# Patient Record
Sex: Male | Born: 1955 | Race: Black or African American | Hispanic: No | Marital: Married | Smoking: Never smoker
Health system: Southern US, Community
[De-identification: ages and names within clinical notes are randomized; demographics above are authoritative.]

## PROBLEM LIST (undated history)

## (undated) DIAGNOSIS — E119 Type 2 diabetes mellitus without complications: Secondary | ICD-10-CM

## (undated) DIAGNOSIS — I10 Essential (primary) hypertension: Secondary | ICD-10-CM

## (undated) DIAGNOSIS — Z87442 Personal history of urinary calculi: Secondary | ICD-10-CM

## (undated) DIAGNOSIS — N400 Enlarged prostate without lower urinary tract symptoms: Secondary | ICD-10-CM

## (undated) DIAGNOSIS — K635 Polyp of colon: Secondary | ICD-10-CM

## (undated) HISTORY — DX: Polyp of colon: K63.5

## (undated) HISTORY — DX: Essential (primary) hypertension: I10

## (undated) HISTORY — DX: Type 2 diabetes mellitus without complications: E11.9

## (undated) HISTORY — DX: Benign prostatic hyperplasia without lower urinary tract symptoms: N40.0

---

## 2007-03-14 ENCOUNTER — Ambulatory Visit: Payer: Self-pay | Admitting: General Surgery

## 2007-03-14 HISTORY — PX: COLONOSCOPY W/ BIOPSIES AND POLYPECTOMY: SHX1376

## 2012-05-15 ENCOUNTER — Encounter: Payer: Self-pay | Admitting: *Deleted

## 2012-06-06 ENCOUNTER — Ambulatory Visit: Payer: Self-pay | Admitting: General Surgery

## 2012-06-26 ENCOUNTER — Ambulatory Visit: Payer: Self-pay | Admitting: General Surgery

## 2012-06-28 ENCOUNTER — Encounter: Payer: Self-pay | Admitting: *Deleted

## 2012-08-15 ENCOUNTER — Ambulatory Visit: Payer: Self-pay | Admitting: General Surgery

## 2012-08-23 ENCOUNTER — Ambulatory Visit: Payer: Self-pay | Admitting: General Surgery

## 2012-09-06 ENCOUNTER — Encounter: Payer: Self-pay | Admitting: General Surgery

## 2012-09-14 ENCOUNTER — Ambulatory Visit (INDEPENDENT_AMBULATORY_CARE_PROVIDER_SITE_OTHER): Payer: BC Managed Care – PPO | Admitting: General Surgery

## 2012-09-14 ENCOUNTER — Encounter: Payer: Self-pay | Admitting: General Surgery

## 2012-09-14 VITALS — BP 138/80 | HR 74 | Resp 18 | Ht 66.0 in | Wt 162.0 lb

## 2012-09-14 DIAGNOSIS — Z860100 Personal history of colon polyps, unspecified: Secondary | ICD-10-CM | POA: Insufficient documentation

## 2012-09-14 MED ORDER — POLYETHYLENE GLYCOL 3350 17 GM/SCOOP PO POWD: ORAL | Status: DC

## 2012-09-14 NOTE — Progress Notes (Signed)
Patient ID: Brendan West, male   DOB: 10-12-1955, 57 y.o.   MRN: [id number]  Chief Complaint  Patient presents with  . Other    evaluation for colonscopy    HPI Brendan West is a 57 y.o. male who presents for an evaluation for colonoscopy. The last colonoscopy was done in 2009 where colon polyps were found. The patients brother also has a history of colon polyps as well. The patient denies any problems at this time.   HPI  Past Medical History  Diagnosis Date  . Hypertension     2008  . Colon polyp   . Prostate enlargement   . Diabetes mellitus without complication     Past Surgical History  Procedure Laterality Date  . Colonoscopy w/ biopsies and polypectomy  03/14/2007    8 mm polyp in the proximal ascending colon resected & retrieved. Diverticulosis sigmoid colon. Familt history of ad3nometous polyps    Family History  Problem Relation Age of Onset  . Colon polyps Brother     2008    Social History History  Substance Use Topics  . Smoking status: Never Smoker   . Smokeless tobacco: Not on file  . Alcohol Use: Yes     Comment: social    No Known Allergies  Current Outpatient Prescriptions  Medication Sig Dispense Refill  . dutasteride (AVODART) 0.5 MG capsule Take 0.5 mg by mouth daily.      . metFORMIN (GLUCOPHAGE) 500 MG tablet Take 500 mg by mouth 2 (two) times daily with a meal.      . tadalafil (CIALIS) 5 MG tablet Take 5 mg by mouth daily as needed for erectile dysfunction.      . valsartan-hydrochlorothiazide (DIOVAN-HCT) 160-12.5 MG per tablet Take 1 tablet by mouth daily.      . polyethylene glycol powder (GLYCOLAX/MIRALAX) powder 255 grams one bottle for colonoscopy prep  255 g  0   No current facility-administered medications for this visit.    Review of Systems Review of Systems  Constitutional: Negative.   Respiratory: Negative.   Cardiovascular: Negative.   Gastrointestinal: Negative.     Blood pressure 138/80, pulse 74, resp. rate 18,  height 5\' 6"  (1.676 m), weight 162 lb (73.483 kg).  Physical Exam Physical Exam  Constitutional: He is oriented to person, place, and time. He appears well-developed and well-nourished.  Neck: No thyromegaly present.  Cardiovascular: Normal rate, regular rhythm and normal heart sounds.   No murmur heard. Pulmonary/Chest: Effort normal and breath sounds normal.  Lymphadenopathy:    He has no cervical adenopathy.  Neurological: He is alert and oriented to person, place, and time.  Skin: Skin is warm and dry.    Data Reviewed Colonoscopy completed March 14, 2007 showed an 8 mm tubular adenoma in the ascending colon.  Assessment    Candidate for screening colonoscopy.     Plan    A colonoscopy will be scheduled at a convenient date.     Patient has been scheduled for a colonoscopy on 11-22-12. This patient has been asked to hold metformin day of colonoscopy prep and procedure.   Brendan West 09/15/2012, 7:49 PM

## 2012-09-14 NOTE — Patient Instructions (Addendum)
Colonoscopy A colonoscopy is an exam to evaluate your entire colon. In this exam, your colon is cleansed. A long fiberoptic tube is inserted through your rectum and into your colon. The fiberoptic scope (endoscope) is a long bundle of enclosed and very flexible fibers. These fibers transmit light to the area examined and send images from that area to your caregiver. Discomfort is usually minimal. You may be given a drug to help you sleep (sedative) during or prior to the procedure. This exam helps to detect lumps (tumors), polyps, inflammation, and areas of bleeding. Your caregiver may also take a small piece of tissue (biopsy) that will be examined under a microscope. LET YOUR CAREGIVER KNOW ABOUT:   Allergies to food or medicine.  Medicines taken, including vitamins, herbs, eyedrops, over-the-counter medicines, and creams.  Use of steroids (by mouth or creams).  Previous problems with anesthetics or numbing medicines.  History of bleeding problems or blood clots.  Previous surgery.  Other health problems, including diabetes and kidney problems.  Possibility of pregnancy, if this applies. BEFORE THE PROCEDURE   A clear liquid diet may be required for 2 days before the exam.  Ask your caregiver about changing or stopping your regular medications.  Liquid injections (enemas) or laxatives may be required.  A large amount of electrolyte solution may be given to you to drink over a short period of time. This solution is used to clean out your colon.  You should be present 60 minutes prior to your procedure or as directed by your caregiver. AFTER THE PROCEDURE   If you received a sedative or pain relieving medication, you will need to arrange for someone to drive you home.  Occasionally, there is a little blood passed with the first bowel movement. Do not be concerned. FINDING OUT THE RESULTS OF YOUR TEST Not all test results are available during your visit. If your test results are  not back during the visit, make an appointment with your caregiver to find out the results. Do not assume everything is normal if you have not heard from your caregiver or the medical facility. It is important for you to follow up on all of your test results. HOME CARE INSTRUCTIONS   It is not unusual to pass moderate amounts of gas and experience mild abdominal cramping following the procedure. This is due to air being used to inflate your colon during the exam. Walking or a warm pack on your belly (abdomen) may help.  You may resume all normal meals and activities after sedatives and medicines have worn off.  Only take over-the-counter or prescription medicines for pain, discomfort, or fever as directed by your caregiver. Do not use aspirin or blood thinners if a biopsy was taken. Consult your caregiver for medicine usage if biopsies were taken. SEEK IMMEDIATE MEDICAL CARE IF:   You have a fever.  You pass large blood clots or fill a toilet with blood following the procedure. This may also occur 10 to 14 days following the procedure. This is more likely if a biopsy was taken.  You develop abdominal pain that keeps getting worse and cannot be relieved with medicine. Document Released: 01/09/2000 Document Revised: 04/05/2011 Document Reviewed: 08/24/2007 [facility] Patient Information 2014 [place], [place].  Patient has been scheduled for a colonoscopy on 11-22-12. This patient has been asked to hold metformin day of colonoscopy prep and procedure.

## 2012-09-15 ENCOUNTER — Encounter: Payer: Self-pay | Admitting: General Surgery

## 2012-11-05 ENCOUNTER — Other Ambulatory Visit: Payer: Self-pay | Admitting: General Surgery

## 2012-11-05 DIAGNOSIS — Z86010 Personal history of colonic polyps: Secondary | ICD-10-CM

## 2012-11-06 ENCOUNTER — Telehealth: Payer: Self-pay | Admitting: *Deleted

## 2012-11-06 NOTE — Telephone Encounter (Signed)
Message has been left for patient to call the office at home and cell numbers.

## 2012-11-06 NOTE — Telephone Encounter (Signed)
Message copied by Nicholes Mango on Mon Nov 06, 2012 10:31 AM ------      Message from: [place], [place]      Created: Sun Nov 05, 2012  8:20 AM       Contact patient to confirm no change in health status.  ------

## 2012-11-07 ENCOUNTER — Telehealth: Payer: Self-pay | Admitting: *Deleted

## 2012-11-07 NOTE — Telephone Encounter (Signed)
Patient reports he has had no change in his health status since last office visit. He also reports medications are still the same. Patient was instructed to pre-register either this week or next. We will proceed with colonoscopy that is scheduled at [facility] for 11-22-12. He was instructed to call the office if he has further questions.

## 2012-11-22 ENCOUNTER — Ambulatory Visit: Payer: Self-pay | Admitting: General Surgery

## 2012-11-22 DIAGNOSIS — Z1211 Encounter for screening for malignant neoplasm of colon: Secondary | ICD-10-CM

## 2012-11-22 DIAGNOSIS — Z86010 Personal history of colonic polyps: Secondary | ICD-10-CM

## 2012-11-22 HISTORY — PX: COLONOSCOPY: SHX174

## 2012-11-23 ENCOUNTER — Encounter: Payer: Self-pay | Admitting: General Surgery

## 2013-04-07 ENCOUNTER — Emergency Department: Payer: Self-pay | Admitting: Internal Medicine

## 2013-04-07 LAB — URINALYSIS, COMPLETE
BACTERIA: NONE SEEN
GLUCOSE, UR: NEGATIVE mg/dL (ref 0–75)
Hyaline Cast: 1
KETONE: NEGATIVE
LEUKOCYTE ESTERASE: NEGATIVE
NITRITE: NEGATIVE
Ph: 5 (ref 4.5–8.0)
Protein: 30
RBC,UR: 91 /HPF (ref 0–5)
Specific Gravity: 1.021 (ref 1.003–1.030)
Squamous Epithelial: 1
WBC UR: NONE SEEN /HPF (ref 0–5)

## 2013-04-07 LAB — COMPREHENSIVE METABOLIC PANEL
ALK PHOS: 47 U/L
ANION GAP: 6 — AB (ref 7–16)
Albumin: 4 g/dL (ref 3.4–5.0)
BILIRUBIN TOTAL: 0.3 mg/dL (ref 0.2–1.0)
BUN: 17 mg/dL (ref 7–18)
CALCIUM: 9.3 mg/dL (ref 8.5–10.1)
CHLORIDE: 110 mmol/L — AB (ref 98–107)
CO2: 25 mmol/L (ref 21–32)
CREATININE: 1.25 mg/dL (ref 0.60–1.30)
EGFR (African American): >60
EGFR (Non-African Amer.): >60
Glucose: 120 mg/dL — ABNORMAL HIGH (ref 65–99)
OSMOLALITY: 284 (ref 275–301)
Potassium: 3.7 mmol/L (ref 3.5–5.1)
SGOT(AST): 37 U/L (ref 15–37)
SGPT (ALT): 55 U/L (ref 12–78)
Sodium: 141 mmol/L (ref 136–145)
TOTAL PROTEIN: 7.6 g/dL (ref 6.4–8.2)

## 2013-04-07 LAB — CBC
HCT: 37.5 % — ABNORMAL LOW (ref 40.0–52.0)
HGB: 12.5 g/dL — AB (ref 13.0–18.0)
MCH: 28.3 pg (ref 26.0–34.0)
MCHC: 33.2 g/dL (ref 32.0–36.0)
MCV: 85 fL (ref 80–100)
Platelet: 191 10*3/uL (ref 150–440)
RBC: 4.41 10*6/uL (ref 4.40–5.90)
RDW: 14.6 % — ABNORMAL HIGH (ref 11.5–14.5)
WBC: 5.7 10*3/uL (ref 3.8–10.6)

## 2013-04-07 LAB — TROPONIN I: Troponin-I: <0.02 ng/mL

## 2014-11-10 IMAGING — CT CT STONE STUDY
1 of 2 series · 15 of 32 positions shown, 19 images · non-contrast
Comparison: None

CLINICAL DATA: Left-sided flank pain, hematuria

EXAM:
CT ABDOMEN AND PELVIS WITHOUT CONTRAST
TECHNIQUE: Multidetector CT imaging of the abdomen and pelvis was performed
following the standard protocol without IV contrast.

[Series 2: stone standard full · axial · 0.83mm/px · z∈[-702,-272]mm · 15 of 94 slices shown, 19 images]
[im 4/94  soft-tissue]
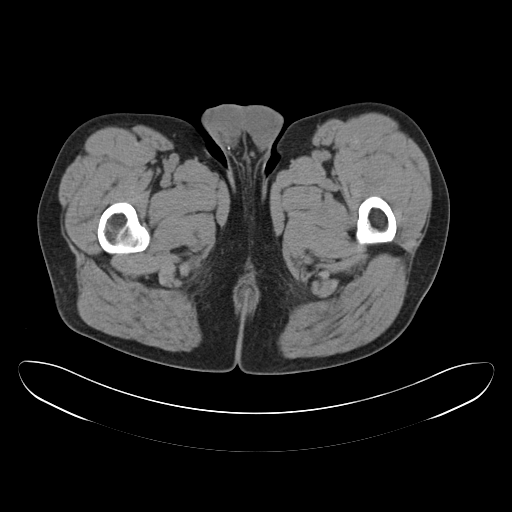
[im 4/94  bone]
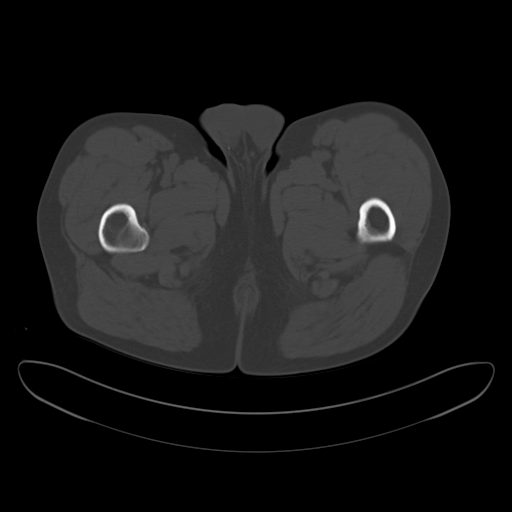
[im 12/94  soft-tissue]
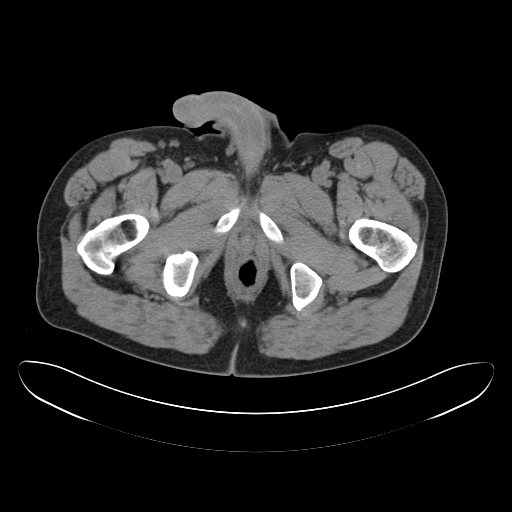
[im 19/94  soft-tissue]
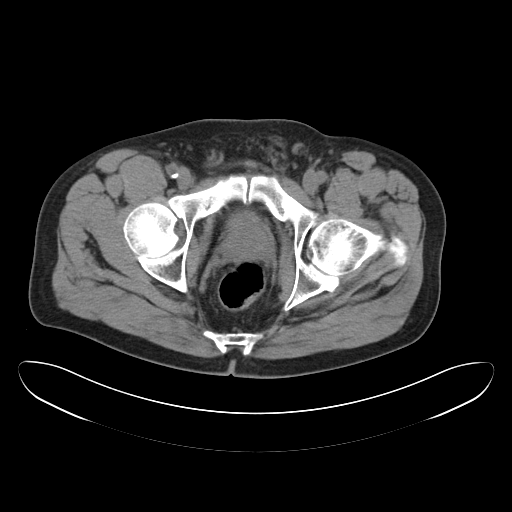
[im 27/94  soft-tissue]
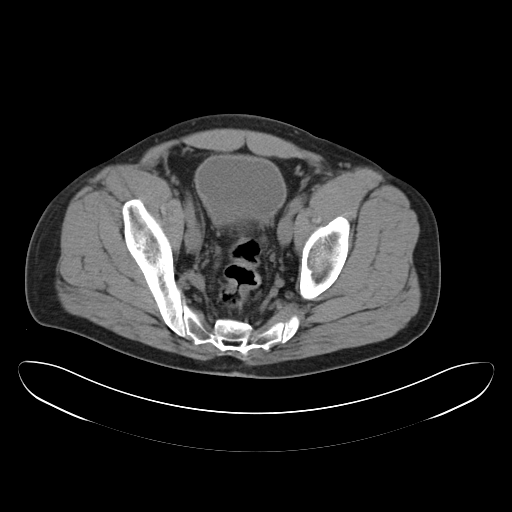
[im 34/94  soft-tissue]
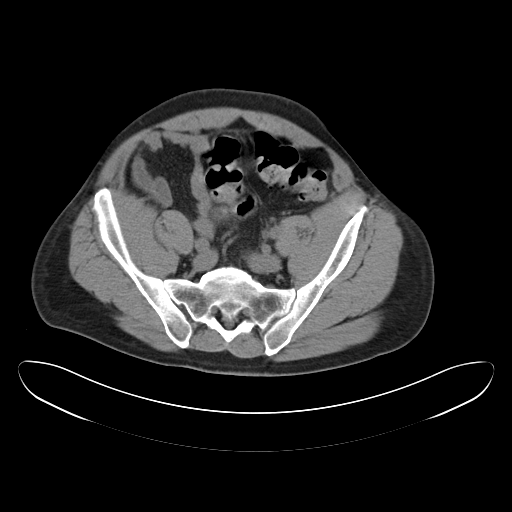
[im 41/94  soft-tissue]
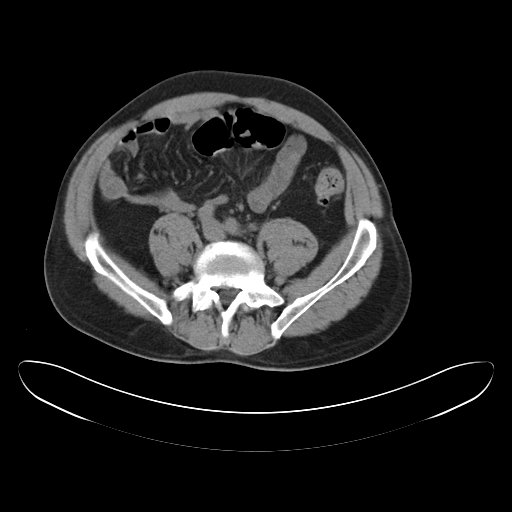
[im 49/94  soft-tissue]
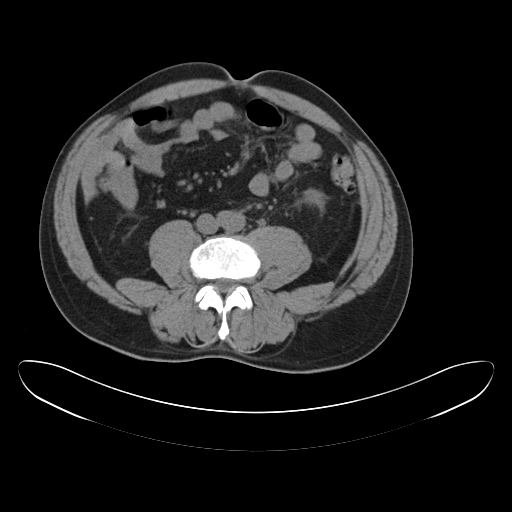
[im 53/94  soft-tissue]
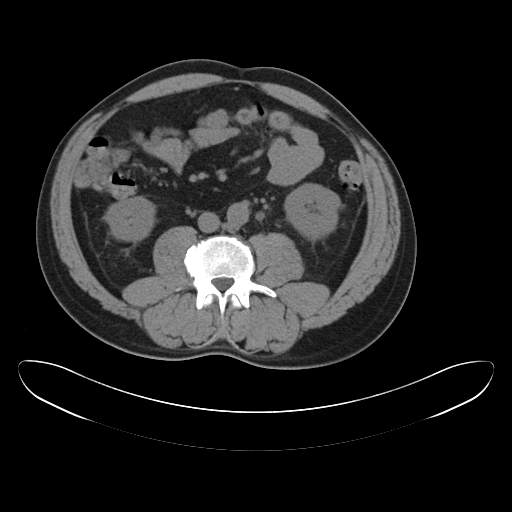
[im 60/94  soft-tissue]
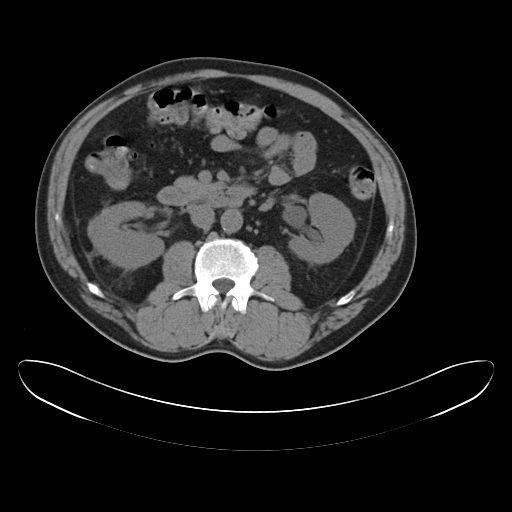
[im 60/94  bone]
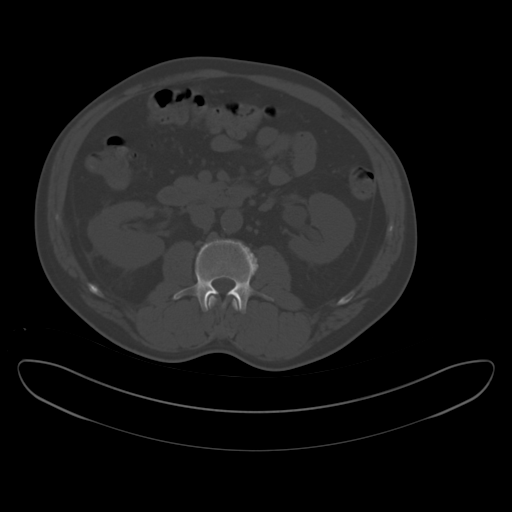
[im 67/94  soft-tissue]
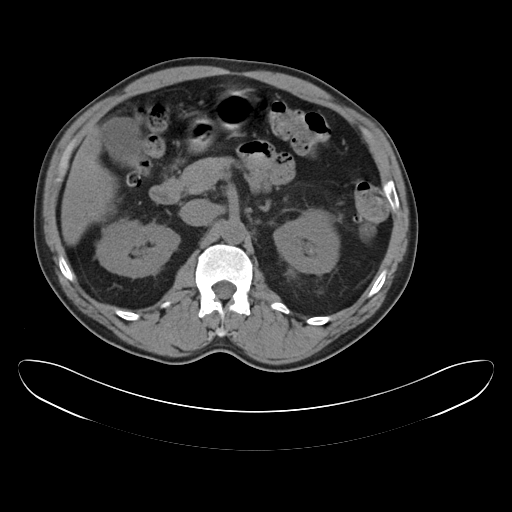
[im 75/94  soft-tissue]
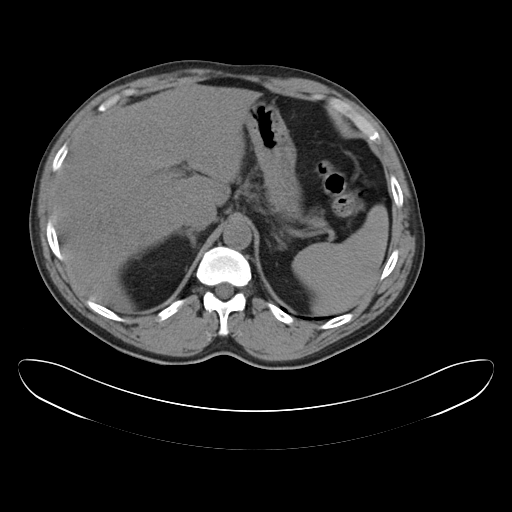
[im 79/94  lung]
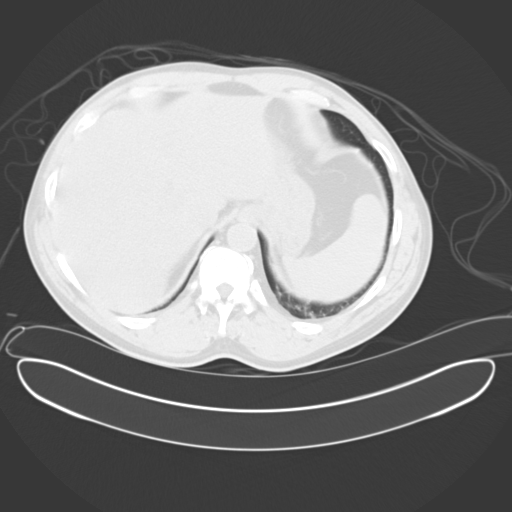
[im 82/94  soft-tissue]
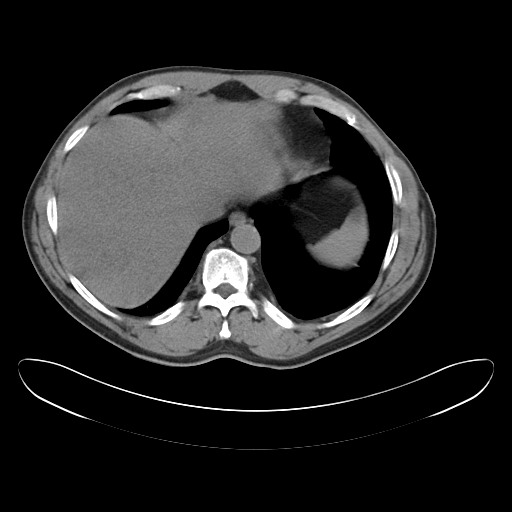
[im 82/94  lung]
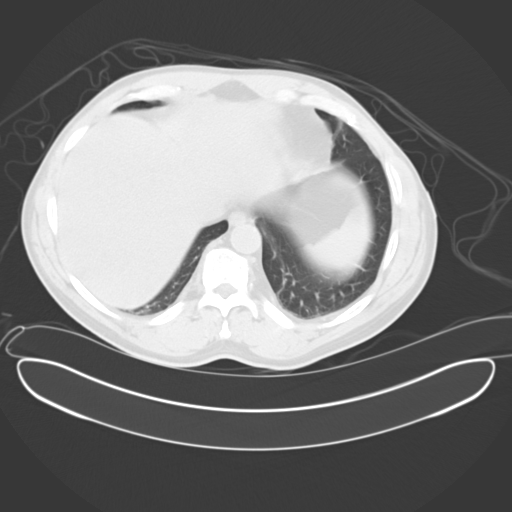
[im 86/94  lung]
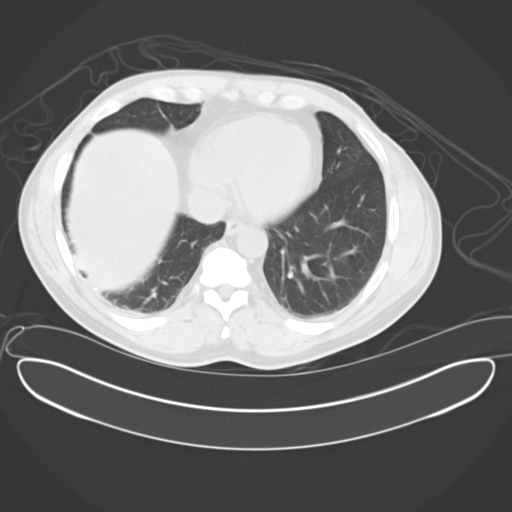
[im 90/94  soft-tissue]
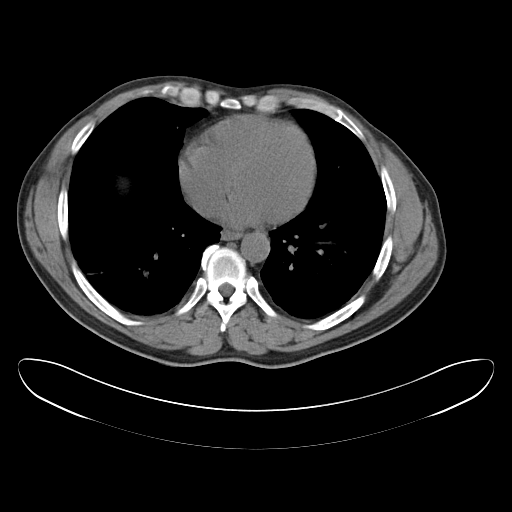
[im 90/94  lung]
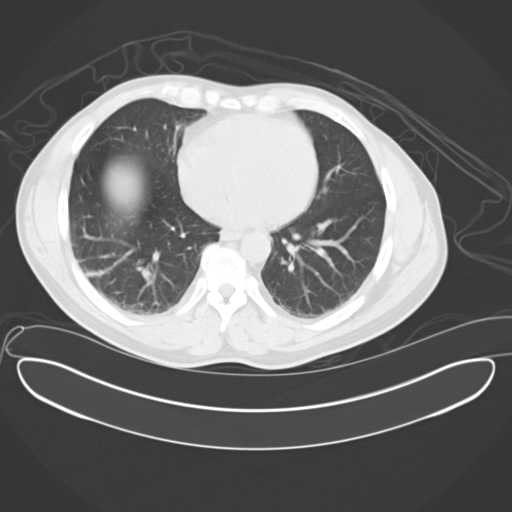

[15 of 32 positions shown; findings below may reference images not displayed]

FINDINGS: Subpleural dependent atelectasis and/or scarring noted. There is
mild left hydroureteronephrosis to the level of a 3 mm left
ureterovesicular junction calculus. Nonobstructing bilateral renal
calculi are noted, including a 1 mm left upper pole calculus image
28 and 1.1 cm right lower renal pole calculus image 39. Moderate
perinephric stranding is nonspecific. No perinephric fluid
collection or significant fluid to suggest definite forniceal
rupture. Unenhanced liver, gallbladder, adrenal glands, spleen, and
pancreas appear unremarkable. No ascites or lymphadenopathy.

Minimal atheromatous aortic calcification without aneurysm. The
appendix is normal. Bladder otherwise unremarkable. No bowel wall
thickening or focal segmental dilatation. No pelvic free fluid or
lymphadenopathy. Mild moderate lumbar spine disc degenerative change
is noted.
IMPRESSION: 3 mm left ureterovesicular junction calculus producing mild proximal
left hydroureteronephrosis.

## 2016-05-28 ENCOUNTER — Emergency Department: Payer: BLUE CROSS/BLUE SHIELD

## 2016-05-28 ENCOUNTER — Emergency Department
Admission: EM | Admit: 2016-05-28 | Discharge: 2016-05-28 | Disposition: A | Discharge status: Home / Self Care | Payer: BLUE CROSS/BLUE SHIELD | Attending: Emergency Medicine | Admitting: Emergency Medicine

## 2016-05-28 ENCOUNTER — Encounter: Payer: Self-pay | Admitting: *Deleted

## 2016-05-28 DIAGNOSIS — M5416 Radiculopathy, lumbar region: Secondary | ICD-10-CM | POA: Diagnosis not present

## 2016-05-28 DIAGNOSIS — Z79899 Other long term (current) drug therapy: Secondary | ICD-10-CM | POA: Diagnosis not present

## 2016-05-28 DIAGNOSIS — E119 Type 2 diabetes mellitus without complications: Secondary | ICD-10-CM | POA: Insufficient documentation

## 2016-05-28 DIAGNOSIS — Z7984 Long term (current) use of oral hypoglycemic drugs: Secondary | ICD-10-CM | POA: Insufficient documentation

## 2016-05-28 DIAGNOSIS — I10 Essential (primary) hypertension: Secondary | ICD-10-CM | POA: Diagnosis not present

## 2016-05-28 DIAGNOSIS — M25551 Pain in right hip: Secondary | ICD-10-CM | POA: Diagnosis present

## 2016-05-28 MED ORDER — TRAMADOL HCL 50 MG PO TABS: 50.0000 mg | ORAL_TABLET | Freq: Four times a day (QID) | ORAL | 0 refills | Status: DC | PRN

## 2016-05-28 NOTE — ED Provider Notes (Signed)
[facility] Emergency Department Provider Note ____________________________________________  Time seen: Approximately 12:24 PM  I have reviewed the triage vital signs and the nursing notes.   HISTORY  Chief Complaint Leg Pain    HPI Brendan West is a 61 y.o. male who presents to the ER for evaluationof right hip and back pain that radiates down the right leg. Symptoms started 4 days ago. He was evaluated at urgent care on Wednesday and started on Flexeril and prednisone. He states that the pain has not improved. 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 He denies similar symptoms in the past. He denies injury.  Past Medical History:  Diagnosis Date  . Colon polyp   . Diabetes mellitus without complication (HCC)   . Hypertension    2008  . Prostate enlargement     Patient Active Problem List   Diagnosis Date Noted  . Personal history of colonic polyps 09/14/2012    Past Surgical History:  Procedure Laterality Date  . COLONOSCOPY W/ BIOPSIES AND POLYPECTOMY  03/14/2007   8 mm polyp in the proximal ascending colon resected & retrieved. Diverticulosis sigmoid colon. Familt history of ad3nometous polyps    Prior to Admission medications   Medication Sig Start Date End Date Taking? Authorizing Provider  dutasteride (AVODART) 0.5 MG capsule Take 0.5 mg by mouth daily.    Historical Provider, MD  metFORMIN (GLUCOPHAGE) 500 MG tablet Take 500 mg by mouth 2 (two) times daily with a meal.    Historical Provider, MD  polyethylene glycol powder (GLYCOLAX/MIRALAX) powder 255 grams one bottle for colonoscopy prep 09/14/12   Earline MayotteJeffrey W Byrnett, MD  tadalafil (CIALIS) 5 MG tablet Take 5 mg by mouth daily as needed for erectile dysfunction.    Historical Provider, MD  traMADol (ULTRAM) 50 MG tablet Take 1 tablet (50 mg total) by mouth every 6 (six) hours as needed. 05/28/16   Chinita Pesterari B Ameli Sangiovanni, FNP   valsartan-hydrochlorothiazide (DIOVAN-HCT) 160-12.5 MG per tablet Take 1 tablet by mouth daily.    Historical Provider, MD    Allergies Patient has no known allergies.  Family History  Problem Relation Age of Onset  . Colon polyps Brother     2008    Social History Social History  Substance Use Topics  . Smoking status: Never Smoker  . Smokeless tobacco: Not on file  . Alcohol use Yes     Comment: social    Review of Systems Constitutional: No recent illness. Cardiovascular: Denies chest pain or palpitations. Respiratory: Denies shortness of breath. Musculoskeletal: Pain in right lower back with radiation into the right lower extremity Skin: Negative for rash, wound, lesion. Neurological: Negative for focal weakness or numbness. Negative for loss of bowel or bladder control.  ____________________________________________   PHYSICAL EXAM:  VITAL SIGNS: ED Triage Vitals  Enc Vitals Group     BP 05/28/16 1119 (!) 160/83     Pulse Rate 05/28/16 1119 90     Resp 05/28/16 1119 20     Temp 05/28/16 1119 98.1 F (36.7 C)     Temp Source 05/28/16 1119 Oral     SpO2 05/28/16 1119 98 %     Weight 05/28/16 1120 170 lb (77.1 kg)     Height 05/28/16 1120 5\' 5"  (1.651 m)     Head Circumference --      Peak Flow --      Pain Score 05/28/16 1119  10     Pain Loc --      Pain Edu? --      Excl. in GC? --     Constitutional: Alert and oriented. Well appearing and in no acute distress. Eyes: Conjunctivae are normal. EOMI. Head: Atraumatic. Neck: No stridor.  Respiratory: Normal respiratory effort.   Musculoskeletal: Straight leg raise positive at about 40. Strength is normal throughout.  Neurologic:  Normal speech and language. No gross focal neurologic deficits are appreciated. Speech is normal. No gait instability. Skin:  Skin is warm, dry and intact. Atraumatic. Psychiatric: Mood and affect are normal. Speech and behavior are  normal.  ____________________________________________   LABS (all labs ordered are listed, but only abnormal results are displayed)  Labs Reviewed - No data to display ____________________________________________  RADIOLOGY  No acute lumbar spine abnormality identified on x-ray per radiology. ____________________________________________   PROCEDURES  Procedure(s) performed: None  ____________________________________________   INITIAL IMPRESSION / ASSESSMENT AND PLAN / ED COURSE  61 year old male presenting to the emergency department for evaluation of right lower back pain that radiates into the posterior right leg. Symptoms are consistent with sciatica. He'll be treated with prednisone, and Flexeril which was prescribed by urgent care. Tramadol be added. He was strongly encouraged to call his primary care doctor to schedule an appointment for Monday. He was encouraged to return to the emergency department over the weekend for symptoms that change or worsen.  Pertinent labs & imaging results that were available during my care of the patient were reviewed by me and considered in my medical decision making (see chart for details).  _________________________________________   FINAL CLINICAL IMPRESSION(S) / ED DIAGNOSES  Final diagnoses:  Lumbar radiculopathy, acute    New Prescriptions   TRAMADOL (ULTRAM) 50 MG TABLET    Take 1 tablet (50 mg total) by mouth every 6 (six) hours as needed.    If controlled substance prescribed during this visit, 12 month history viewed on the [facility] prior to issuing an initial prescription for Schedule II or III opiod.    Chinita Pester, FNP 05/28/16 1324    Myrna Blazer, MD 05/28/16 1455

## 2016-05-28 NOTE — Discharge Instructions (Signed)
Follow up with your primary care provider next week.  Continue taking the muscle relaxer and prednisone and add tramadol. Return to the ER for symptoms that change or worsen if unable to schedule an appointment.

## 2016-05-28 NOTE — ED Triage Notes (Signed)
Pt complains of right hip /back pain radiating down leg, pt denies any other symptoms , pain started Monday

## 2017-12-01 ENCOUNTER — Ambulatory Visit (INDEPENDENT_AMBULATORY_CARE_PROVIDER_SITE_OTHER): Payer: BLUE CROSS/BLUE SHIELD | Admitting: General Surgery

## 2017-12-01 ENCOUNTER — Encounter: Payer: Self-pay | Admitting: General Surgery

## 2017-12-01 ENCOUNTER — Other Ambulatory Visit: Payer: Self-pay

## 2017-12-01 VITALS — BP 130/70 | HR 82 | Resp 14 | Ht 66.0 in | Wt 162.0 lb

## 2017-12-01 DIAGNOSIS — Z86010 Personal history of colonic polyps: Secondary | ICD-10-CM

## 2017-12-01 MED ORDER — POLYETHYLENE GLYCOL 3350 17 GM/SCOOP PO POWD: ORAL | 0 refills | Status: DC

## 2017-12-01 NOTE — Progress Notes (Signed)
Patient ID: Brendan West, male   DOB: [birth year], 62 y.o.   MRN: [id number]  Chief Complaint  Patient presents with  . Colonoscopy    HPI Brendan West is a 62 y.o. male here today for his follow up colonoscopy. Last colonoscopy was 11/22/2012. Moves his bowels daily. No GI problems at this time.   Past Medical History:  Diagnosis Date  . Colon polyp   . Diabetes mellitus without complication (HCC)   . Hypertension    2008  . Prostate enlargement     Past Surgical History:  Procedure Laterality Date  . COLONOSCOPY  11/22/2012   Diverticulosis  . COLONOSCOPY W/ BIOPSIES AND POLYPECTOMY  03/14/2007   8 mm tubular adenoma without atypica in the proximal ascending colon resected & retrieved. Diverticulosis sigmoid colon. Family history of adenometous polyps    Family History  Problem Relation Age of Onset  . Colon polyps Brother        2008    Social History Social History   Tobacco Use  . Smoking status: Never Smoker  . Smokeless tobacco: Never Used  Substance Use Topics  . Alcohol use: Yes    Comment: social  . Drug use: No    No Known Allergies  Current Outpatient Medications  Medication Sig Dispense Refill  . empagliflozin (JARDIANCE) 10 MG TABS tablet Take 10 mg by mouth daily.    . finasteride (PROSCAR) 5 MG tablet Take 5 mg by mouth daily.    . metFORMIN (GLUCOPHAGE) 500 MG tablet Take 500 mg by mouth 2 (two) times daily with a meal.    . polyethylene glycol powder (GLYCOLAX/MIRALAX) powder 255 grams one bottle for colonoscopy prep 255 g 0  . sildenafil (REVATIO) 20 MG tablet Take 20 mg by mouth 3 (three) times daily.    . valsartan-hydrochlorothiazide (DIOVAN-HCT) 160-12.5 MG per tablet Take 1 tablet by mouth daily.    . polyethylene glycol powder (GLYCOLAX/MIRALAX) powder 255 grams one bottle for colonoscopy prep 255 g 0   No current facility-administered medications for this visit.     Review of Systems Review of Systems  Constitutional: Negative.    Respiratory: Negative.   Cardiovascular: Negative.     Blood pressure 130/70, pulse 82, resp. rate 14, height 5\' 6"  (1.676 m), weight 162 lb (73.5 kg).  Physical Exam Physical Exam  Constitutional: He appears well-developed and well-nourished.  Cardiovascular: Normal rate, regular rhythm and normal heart sounds.  Pulmonary/Chest: Effort normal and breath sounds normal.  Neurological: He is alert.  Skin: Skin is warm and dry.    Data Reviewed 2014 colonoscopy report showed diverticulosis.  No recurrent polyps.  Assessment    Candidate for repeat colonoscopy based on 2009 exam.    Plan  Colonoscopy with possible biopsy/polypectomy prn: Information regarding the procedure, including its potential risks and complications (including but not limited to perforation of the bowel, which may require emergency surgery to repair, and bleeding) was verbally given to the patient. Educational information regarding lower intestinal endoscopy was given to the patient. Written instructions for how to complete the bowel prep using Miralax were provided. The importance of drinking ample fluids to avoid dehydration as a result of the prep emphasized.  HPI, Physical Exam, Assessment and Plan have been scribed under the direction and in the presence of Donnalee Curry, MD.  [name], CMA   [name] 12/02/2017, 6:10 AM  I have completed the exam and reviewed the above documentation for accuracy and completeness.  I  agree with the above.  Museum/gallery conservator has been used and any errors in dictation or transcription are unintentional.  Donnalee Curry, M.D., F.A.C.S.  Patient has been scheduled for a colonoscopy on 12-07-17 at [facility]. This patient has been asked to hold metformin day of colonoscopy prep and procedure. Miralax prescription has been sent in to the patient's pharmacy today. Colonoscopy instructions have been reviewed with the patient. This patient is aware to call the office  if they have further questions.   Nicholes Mango, CMA

## 2017-12-01 NOTE — Patient Instructions (Signed)
Colonoscopy, Adult A colonoscopy is an exam to look at the entire large intestine. During the exam, a lubricated, bendable tube is inserted into the anus and then passed into the rectum, colon, and other parts of the large intestine. A colonoscopy is often done as a part of normal colorectal screening or in response to certain symptoms, such as anemia, persistent diarrhea, abdominal pain, and blood in the stool. The exam can help screen for and diagnose medical problems, including:  Tumors.  Polyps.  Inflammation.  Areas of bleeding.  Tell a health care provider about:  Any allergies you have.  All medicines you are taking, including vitamins, herbs, eye drops, creams, and over-the-counter medicines.  Any problems you or family members have had with anesthetic medicines.  Any blood disorders you have.  Any surgeries you have had.  Any medical conditions you have.  Any problems you have had passing stool. What are the risks? Generally, this is a safe procedure. However, problems may occur, including:  Bleeding.  A tear in the intestine.  A reaction to medicines given during the exam.  Infection (rare).  What happens before the procedure? Eating and drinking restrictions Follow instructions from your health care provider about eating and drinking, which may include:  A few days before the procedure - follow a low-fiber diet. Avoid nuts, seeds, dried fruit, raw fruits, and vegetables.  1-3 days before the procedure - follow a clear liquid diet. Drink only clear liquids, such as clear broth or bouillon, black coffee or tea, clear juice, clear soft drinks or sports drinks, gelatin dessert, and popsicles. Avoid any liquids that contain red or purple dye.  On the day of the procedure - do not eat or drink anything during the 2 hours before the procedure, or within the time period that your health care provider recommends.  Bowel prep If you were prescribed an oral bowel prep  to clean out your colon:  Take it as told by your health care provider. Starting the day before your procedure, you will need to drink a large amount of medicated liquid. The liquid will cause you to have multiple loose stools until your stool is almost clear or light green.  If your skin or anus gets irritated from diarrhea, you may use these to relieve the irritation: ? Medicated wipes, such as adult wet wipes with aloe and vitamin E. ? A skin soothing-product like petroleum jelly.  If you vomit while drinking the bowel prep, take a break for up to 60 minutes and then begin the bowel prep again. If vomiting continues and you cannot take the bowel prep without vomiting, call your health care provider.  General instructions  Ask your health care provider about changing or stopping your regular medicines. This is especially important if you are taking diabetes medicines or blood thinners.  Plan to have someone take you home from the hospital or clinic. What happens during the procedure?  An IV tube may be inserted into one of your veins.  You will be given medicine to help you relax (sedative).  To reduce your risk of infection: ? Your health care team will wash or sanitize their hands. ? Your anal area will be washed with soap.  You will be asked to lie on your side with your knees bent.  Your health care provider will lubricate a long, thin, flexible tube. The tube will have a camera and a light on the end.  The tube will be inserted into your   anus.  The tube will be gently eased through your rectum and colon.  Air will be delivered into your colon to keep it open. You may feel some pressure or cramping.  The camera will be used to take images during the procedure.  A small tissue sample may be removed from your body to be examined under a microscope (biopsy). If any potential problems are found, the tissue will be sent to a lab for testing.  If small polyps are found, your  health care provider may remove them and have them checked for cancer cells.  The tube that was inserted into your anus will be slowly removed. The procedure may vary among health care providers and hospitals. What happens after the procedure?  Your blood pressure, heart rate, breathing rate, and blood oxygen level will be monitored until the medicines you were given have worn off.  Do not drive for 24 hours after the exam.  You may have a small amount of blood in your stool.  You may pass gas and have mild abdominal cramping or bloating due to the air that was used to inflate your colon during the exam.  It is up to you to get the results of your procedure. Ask your health care provider, or the department performing the procedure, when your results will be ready. This information is not intended to replace advice given to you by your health care provider. Make sure you discuss any questions you have with your health care provider. Document Released: 01/09/2000 Document Revised: 11/12/2015 Document Reviewed: 03/25/2015 [employer] Interactive Patient Education  2018 [employer]  

## 2017-12-07 ENCOUNTER — Ambulatory Visit: Payer: BLUE CROSS/BLUE SHIELD | Admitting: Registered Nurse

## 2017-12-07 ENCOUNTER — Encounter
Admission: RE | Disposition: A | Discharge status: Home / Self Care | Payer: Self-pay | Source: Ambulatory Visit | Attending: General Surgery

## 2017-12-07 ENCOUNTER — Ambulatory Visit
Admission: RE | Admit: 2017-12-07 | Discharge: 2017-12-07 | Disposition: A | Discharge status: Home / Self Care | Payer: BLUE CROSS/BLUE SHIELD | Source: Ambulatory Visit | Attending: General Surgery | Admitting: General Surgery

## 2017-12-07 ENCOUNTER — Encounter: Payer: Self-pay | Admitting: *Deleted

## 2017-12-07 DIAGNOSIS — Z7984 Long term (current) use of oral hypoglycemic drugs: Secondary | ICD-10-CM | POA: Insufficient documentation

## 2017-12-07 DIAGNOSIS — Z86010 Personal history of colonic polyps: Secondary | ICD-10-CM | POA: Insufficient documentation

## 2017-12-07 DIAGNOSIS — Z1211 Encounter for screening for malignant neoplasm of colon: Secondary | ICD-10-CM | POA: Insufficient documentation

## 2017-12-07 DIAGNOSIS — N400 Enlarged prostate without lower urinary tract symptoms: Secondary | ICD-10-CM | POA: Diagnosis not present

## 2017-12-07 DIAGNOSIS — I10 Essential (primary) hypertension: Secondary | ICD-10-CM | POA: Diagnosis not present

## 2017-12-07 DIAGNOSIS — Z87442 Personal history of urinary calculi: Secondary | ICD-10-CM | POA: Diagnosis not present

## 2017-12-07 DIAGNOSIS — E119 Type 2 diabetes mellitus without complications: Secondary | ICD-10-CM | POA: Insufficient documentation

## 2017-12-07 DIAGNOSIS — K5730 Diverticulosis of large intestine without perforation or abscess without bleeding: Secondary | ICD-10-CM

## 2017-12-07 DIAGNOSIS — Z79899 Other long term (current) drug therapy: Secondary | ICD-10-CM | POA: Insufficient documentation

## 2017-12-07 DIAGNOSIS — Z8371 Family history of colonic polyps: Secondary | ICD-10-CM | POA: Insufficient documentation

## 2017-12-07 HISTORY — DX: Personal history of urinary calculi: Z87.442

## 2017-12-07 HISTORY — PX: COLONOSCOPY WITH PROPOFOL: SHX5780

## 2017-12-07 LAB — GLUCOSE, CAPILLARY: Glucose-Capillary: 161 mg/dL — ABNORMAL HIGH (ref 70–99)

## 2017-12-07 SURGERY — COLONOSCOPY WITH PROPOFOL
Anesthesia: General

## 2017-12-07 MED ORDER — LIDOCAINE HCL (CARDIAC) PF 100 MG/5ML IV SOSY
PREFILLED_SYRINGE | INTRAVENOUS | Status: DC | PRN
  Administered 2017-12-07: 40 mg via INTRAVENOUS

## 2017-12-07 MED ORDER — MIDAZOLAM HCL 2 MG/2ML IJ SOLN
INTRAMUSCULAR | Status: DC | PRN
  Administered 2017-12-07: 2 mg via INTRAVENOUS

## 2017-12-07 MED ORDER — SODIUM CHLORIDE 0.9 % IV SOLN
INTRAVENOUS | Status: DC
  Administered 2017-12-07: 1000 mL via INTRAVENOUS

## 2017-12-07 MED ORDER — MIDAZOLAM HCL 2 MG/2ML IJ SOLN
INTRAMUSCULAR | Status: AC
  Filled 2017-12-07: qty 2

## 2017-12-07 MED ORDER — PHENYLEPHRINE HCL 10 MG/ML IJ SOLN
INTRAMUSCULAR | Status: DC | PRN
  Administered 2017-12-07 (×3): 100 ug via INTRAVENOUS

## 2017-12-07 MED ORDER — PROPOFOL 500 MG/50ML IV EMUL
INTRAVENOUS | Status: AC
  Filled 2017-12-07: qty 50

## 2017-12-07 MED ORDER — PROPOFOL 500 MG/50ML IV EMUL
INTRAVENOUS | Status: DC | PRN
  Administered 2017-12-07: 150 ug/kg/min via INTRAVENOUS

## 2017-12-07 MED ORDER — PROPOFOL 10 MG/ML IV BOLUS
INTRAVENOUS | Status: DC | PRN
  Administered 2017-12-07: 90 mg via INTRAVENOUS

## 2017-12-07 NOTE — Op Note (Signed)
[facility] Gastroenterology Patient Name: Brendan GingerMack West Procedure Date: 12/07/2017 11:25 AM MRN: [id number] Account #: [id number] Date of Birth: 10/27/1955 Admit Type: Outpatient Age: 62 Room: [facility] END[facility]OM 1 Gender: Male Note Status: Finalized Procedure:            Colonoscopy Indications:          High risk colon cancer surveillance: Personal history                        of colonic polyps Providers:            Earline MayotteJeffrey W. Janise Gora, MD Referring MD:         Jillene Bucksenny C. Arlana Pouchate, MD (Referring MD) Medicines:            Monitored Anesthesia Care Complications:        No immediate complications. Procedure:            Pre-Anesthesia Assessment:                       - Prior to the procedure, a History and Physical was                        performed, and patient medications, allergies and                        sensitivities were reviewed. The patient's tolerance of                        previous anesthesia was reviewed.                       - The risks and benefits of the procedure and the                        sedation options and risks were discussed with the                        patient. All questions were answered and informed                        consent was obtained.                       After obtaining informed consent, the colonoscope was                        passed under direct vision. Throughout the procedure,                        the patient's blood pressure, pulse, and oxygen                        saturations were monitored continuously. The                        Colonoscope was introduced through the anus and                        advanced to the the cecum, identified by appendiceal  orifice and ileocecal valve. The colonoscopy was                        performed without difficulty. The patient tolerated the                        procedure well. The quality of the bowel preparation                        was  excellent. Findings:      Many medium-mouthed diverticula were found in the sigmoid colon.      The retroflexed view of the distal rectum and anal verge was normal and       showed no anal or rectal abnormalities. Impression:           - Diverticulosis in the sigmoid colon.                       - The distal rectum and anal verge are normal on                        retroflexion view.                       - No specimens collected. Recommendation:       - Repeat colonoscopy in 5 years for screening purposes. Procedure Code(s):    --- Professional ---                       [phone], Colonoscopy, flexible; diagnostic, including                        collection of specimen(s) by brushing or washing, when                        performed (separate procedure) Diagnosis Code(s):    --- Professional ---                       K57.30, Diverticulosis of large intestine without                        perforation or abscess without bleeding                       Z86.010, Personal history of colonic polyps CPT copyright 2018 American Medical Association. All rights reserved. The codes documented in this report are preliminary and upon coder review may  be revised to meet current compliance requirements. [name], MD 12/07/2017 11:52:53 AM This report has been signed electronically. Number of Addenda: 0 Note Initiated On: 12/07/2017 11:25 AM Scope Withdrawal Time: 0 hours 13 minutes 37 seconds  Total Procedure Duration: 0 hours 18 minutes 22 seconds       [facility]

## 2017-12-07 NOTE — Anesthesia Post-op Follow-up Note (Signed)
Anesthesia QCDR form completed.        

## 2017-12-07 NOTE — Anesthesia Procedure Notes (Signed)
Date/Time: 12/07/2017 11:29 AM Performed by: Stormy Fabianurtis, Rosendo Couser, CRNA Pre-anesthesia Checklist: Patient identified, Emergency Drugs available, Suction available and Patient being monitored Patient Re-evaluated:Patient Re-evaluated prior to induction Oxygen Delivery Method: Nasal cannula Induction Type: IV induction Dental Injury: Teeth and Oropharynx as per pre-operative assessment  Comments: Nasal cannula with etCO2 monitoring

## 2017-12-07 NOTE — Transfer of Care (Signed)
Immediate Anesthesia Transfer of Care Note  Patient: [name]  Procedure(s) Performed: Procedure(s): COLONOSCOPY WITH PROPOFOL (N/A)  Patient Location: PACU and Endoscopy Unit  Anesthesia Type:General  Level of Consciousness: sedated  Airway & Oxygen Therapy: Patient Spontanous Breathing and Patient connected to nasal cannula oxygen  Post-op Assessment: Report given to RN and Post -op Vital signs reviewed and stable  Post vital signs: Reviewed and stable  Last Vitals:  Vitals:   12/07/17 1026 12/07/17 1153  BP: (!) 142/94 (!) 88/49  Pulse: 88 77  Resp: 14 16  Temp: 36.6 C (!) 36.2 C  SpO2: 100% 97%    Complications: No apparent anesthesia complications

## 2017-12-07 NOTE — Anesthesia Preprocedure Evaluation (Signed)
Anesthesia Evaluation  Patient identified by MRN, date of birth, ID band Patient awake    Reviewed: Allergy & Precautions, H&P , NPO status , Patient's Chart, lab work & pertinent test results, reviewed documented beta blocker date and time   Airway Mallampati: II   Neck ROM: full    Dental  (+) Teeth Intact   Pulmonary neg pulmonary ROS,    Pulmonary exam normal        Cardiovascular hypertension, negative cardio ROS Normal cardiovascular exam Rhythm:regular Rate:Normal     Neuro/Psych negative neurological ROS  negative psych ROS   GI/Hepatic negative GI ROS, Neg liver ROS,   Endo/Other  negative endocrine ROSdiabetes  Renal/GU negative Renal ROS  negative genitourinary   Musculoskeletal   Abdominal   Peds  Hematology negative hematology ROS (+)   Anesthesia Other Findings Past Medical History: No date: Colon polyp No date: Diabetes mellitus without complication (HCC) No date: History of kidney stones No date: Hypertension     Comment:  2008 No date: Prostate enlargement Past Surgical History: 11/22/2012: COLONOSCOPY     Comment:  Diverticulosis 03/14/2007: COLONOSCOPY W/ BIOPSIES AND POLYPECTOMY     Comment:  8 mm tubular adenoma without atypica in the proximal               ascending colon resected & retrieved. Diverticulosis               sigmoid colon. Family history of adenometous polyps BMI    Body Mass Index:  26.15 kg/m     Reproductive/Obstetrics negative OB ROS                             Anesthesia Physical Anesthesia Plan  ASA: II  Anesthesia Plan: General   Post-op Pain Management:    Induction:   PONV Risk Score and Plan:   Airway Management Planned:   Additional Equipment:   Intra-op Plan:   Post-operative Plan:   Informed Consent: I have reviewed the patients History and Physical, chart, labs and discussed the procedure including the risks,  benefits and alternatives for the proposed anesthesia with the patient or authorized representative who has indicated his/her understanding and acceptance.   Dental Advisory Given  Plan Discussed with: CRNA  Anesthesia Plan Comments:         Anesthesia Quick Evaluation

## 2017-12-07 NOTE — H&P (Signed)
Brendan West [id number] July 19, 1955     HPI:  History of adenomatous polyps in the ascending colon in 2009. Diverticulosis in 2014. For follow up exam. Tolerated prep well.   Medications Prior to Admission  Medication Sig Dispense Refill Last Dose  . empagliflozin (JARDIANCE) 10 MG TABS tablet Take 10 mg by mouth daily.   Taking  . finasteride (PROSCAR) 5 MG tablet Take 5 mg by mouth daily.   Taking  . metFORMIN (GLUCOPHAGE) 500 MG tablet Take 500 mg by mouth 2 (two) times daily with a meal.   Taking  . polyethylene glycol powder (GLYCOLAX/MIRALAX) powder 255 grams one bottle for colonoscopy prep 255 g 0 Taking  . polyethylene glycol powder (GLYCOLAX/MIRALAX) powder 255 grams one bottle for colonoscopy prep 255 g 0   . sildenafil (REVATIO) 20 MG tablet Take 20 mg by mouth 3 (three) times daily.   Taking  . valsartan-hydrochlorothiazide (DIOVAN-HCT) 160-12.5 MG per tablet Take 1 tablet by mouth daily.   Taking   No Known Allergies Past Medical History:  Diagnosis Date  . Colon polyp   . Diabetes mellitus without complication (HCC)   . History of kidney stones   . Hypertension    2008  . Prostate enlargement    Past Surgical History:  Procedure Laterality Date  . COLONOSCOPY  11/22/2012   Diverticulosis  . COLONOSCOPY W/ BIOPSIES AND POLYPECTOMY  03/14/2007   8 mm tubular adenoma without atypica in the proximal ascending colon resected & retrieved. Diverticulosis sigmoid colon. Family history of adenometous polyps   Social History   Socioeconomic History  . Marital status: Married    Spouse name: Not on file  . Number of children: Not on file  . Years of education: Not on file  . Highest education level: Not on file  Occupational History  . Not on file  Social Needs  . Financial resource strain: Not on file  . Food insecurity:    Worry: Not on file    Inability: Not on file  . Transportation needs:    Medical: Not on file    Non-medical: Not on file  Tobacco Use  .  Smoking status: Never Smoker  . Smokeless tobacco: Never Used  Substance and Sexual Activity  . Alcohol use: Yes    Comment: social  . Drug use: No  . Sexual activity: Not on file  Lifestyle  . Physical activity:    Days per week: Not on file    Minutes per session: Not on file  . Stress: Not on file  Relationships  . Social connections:    Talks on phone: Not on file    Gets together: Not on file    Attends religious service: Not on file    Active member of club or organization: Not on file    Attends meetings of clubs or organizations: Not on file    Relationship status: Not on file  . Intimate partner violence:    Fear of current or ex partner: Not on file    Emotionally abused: Not on file    Physically abused: Not on file    Forced sexual activity: Not on file  Other Topics Concern  . Not on file  Social History Narrative  . Not on file   Social History   Social History Narrative  . Not on file     ROS: Negative.     PE: HEENT: Negative. Lungs: Clear. Cardio: RR.  Assessment/Plan:  Proceed with planned endoscopy.  Merrily PewJeffrey W Generoso Cropper 12/07/2017

## 2017-12-08 ENCOUNTER — Encounter: Payer: Self-pay | Admitting: General Surgery

## 2017-12-08 NOTE — Anesthesia Postprocedure Evaluation (Signed)
Anesthesia Post Note  Patient: Brendan West  Procedure(s) Performed: COLONOSCOPY WITH PROPOFOL (N/A )  Patient location during evaluation: PACU Anesthesia Type: General Level of consciousness: awake and alert Pain management: pain level controlled Vital Signs Assessment: post-procedure vital signs reviewed and stable Respiratory status: spontaneous breathing, nonlabored ventilation, respiratory function stable and patient connected to nasal cannula oxygen Cardiovascular status: blood pressure returned to baseline and stable Postop Assessment: no apparent nausea or vomiting Anesthetic complications: no     Last Vitals:  Vitals:   12/07/17 1213 12/07/17 1223  BP: 110/78 131/78  Pulse: 71 67  Resp: (!) 22 15  Temp:    SpO2: 100% 100%    Last Pain:  Vitals:   12/07/17 1223  TempSrc:   PainSc: 0-No pain                 Yevette EdwardsJames G Adams

## 2018-08-23 ENCOUNTER — Other Ambulatory Visit: Payer: Self-pay | Admitting: Internal Medicine

## 2018-08-23 DIAGNOSIS — R7989 Other specified abnormal findings of blood chemistry: Secondary | ICD-10-CM

## 2018-08-25 ENCOUNTER — Ambulatory Visit
Admission: RE | Admit: 2018-08-25 | Discharge: 2018-08-25 | Disposition: A | Discharge status: Home / Self Care | Payer: BC Managed Care – PPO | Source: Ambulatory Visit | Attending: Internal Medicine | Admitting: Internal Medicine

## 2018-08-25 ENCOUNTER — Encounter (INDEPENDENT_AMBULATORY_CARE_PROVIDER_SITE_OTHER): Payer: Self-pay

## 2018-08-25 ENCOUNTER — Other Ambulatory Visit: Payer: Self-pay

## 2018-08-25 DIAGNOSIS — R945 Abnormal results of liver function studies: Secondary | ICD-10-CM | POA: Insufficient documentation

## 2018-08-25 DIAGNOSIS — R7989 Other specified abnormal findings of blood chemistry: Secondary | ICD-10-CM

## 2020-03-29 IMAGING — US ULTRASOUND ABDOMEN COMPLETE
1 series · 14 of 25 positions shown · non-contrast
Comparison: None.

CLINICAL DATA: Elevated LFTs.

EXAM:
ABDOMEN ULTRASOUND COMPLETE

[Series 1: ultrasound abdomen complete · 0.22mm/px · 14 of 104 slices shown]
[im 1/104]
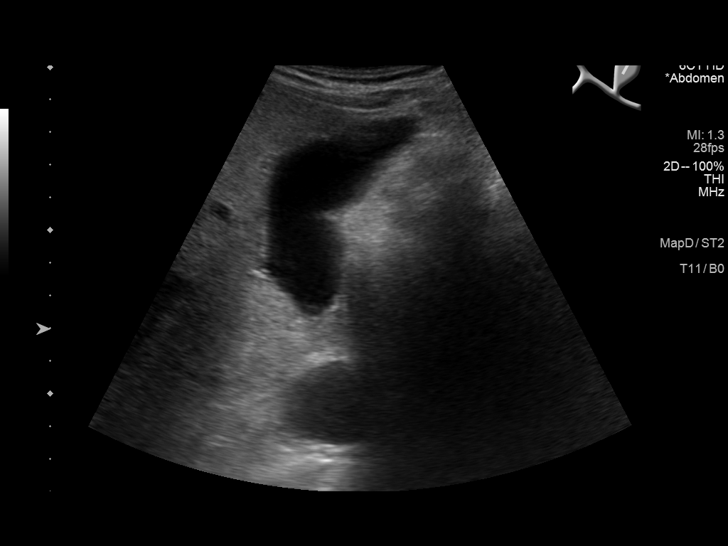
[im 9/104]
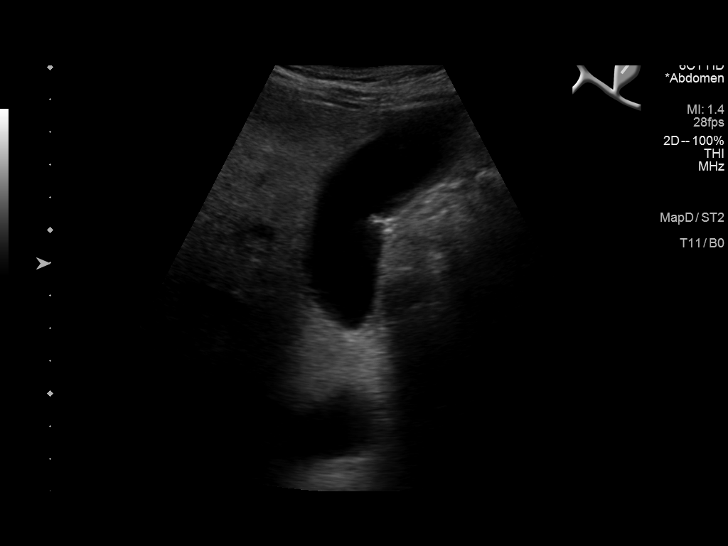
[im 18/104]
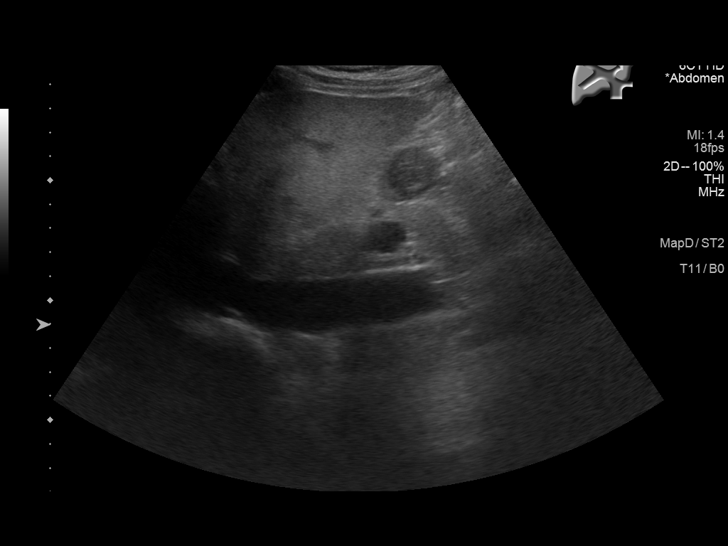
[im 26/104]
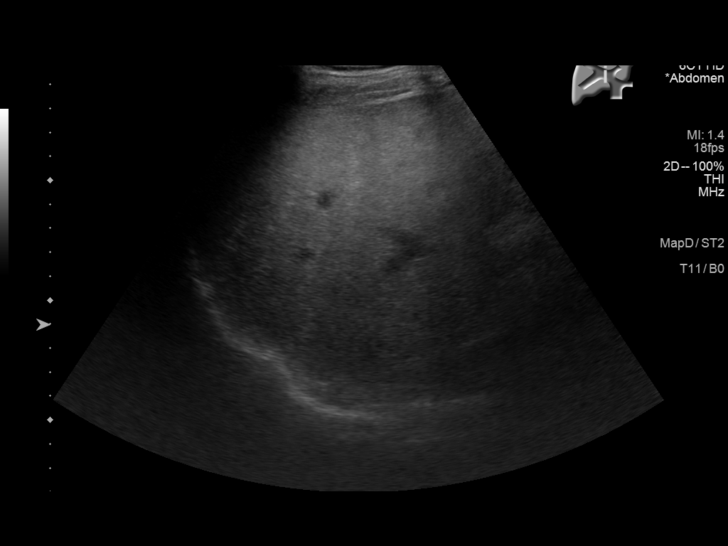
[im 35/104]
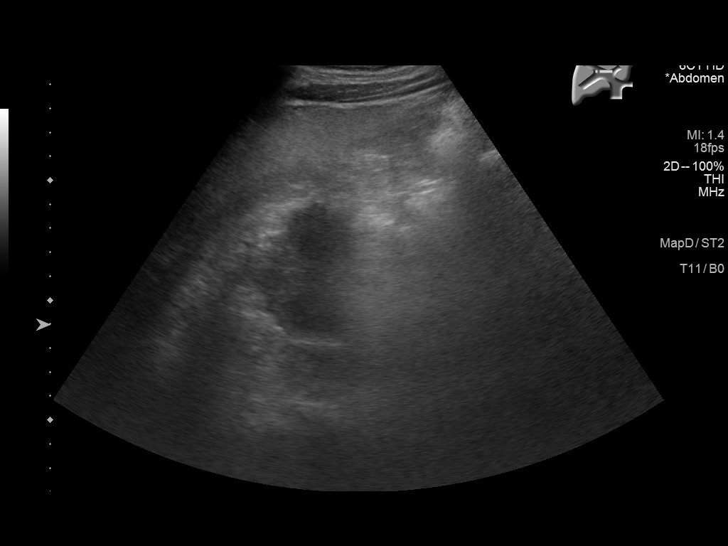
[im 39/104]
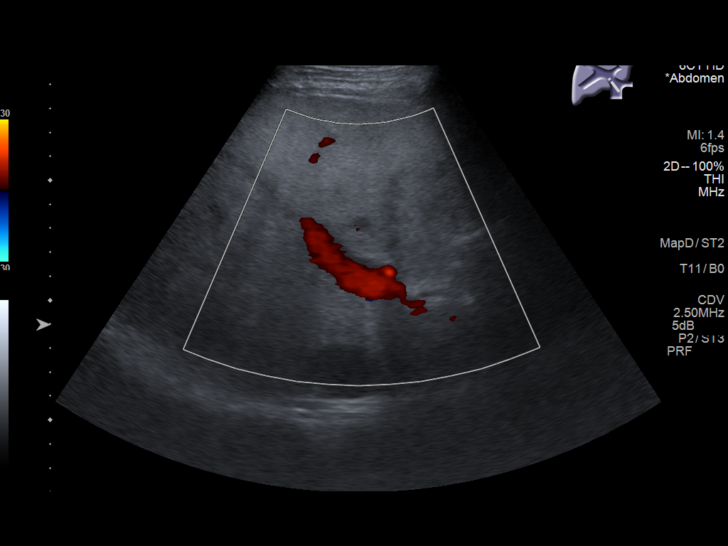
[im 48/104]
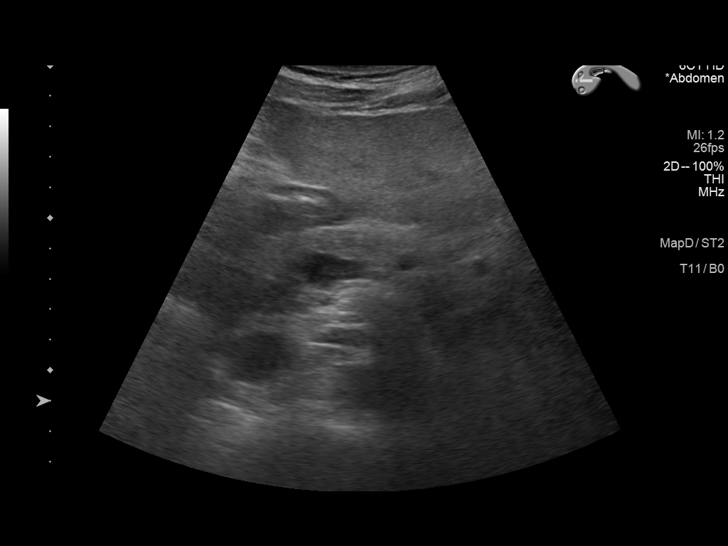
[im 56/104]
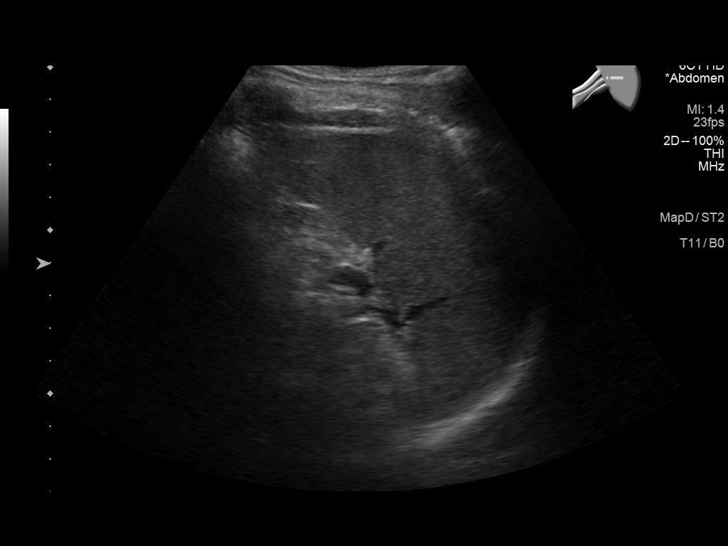
[im 65/104]
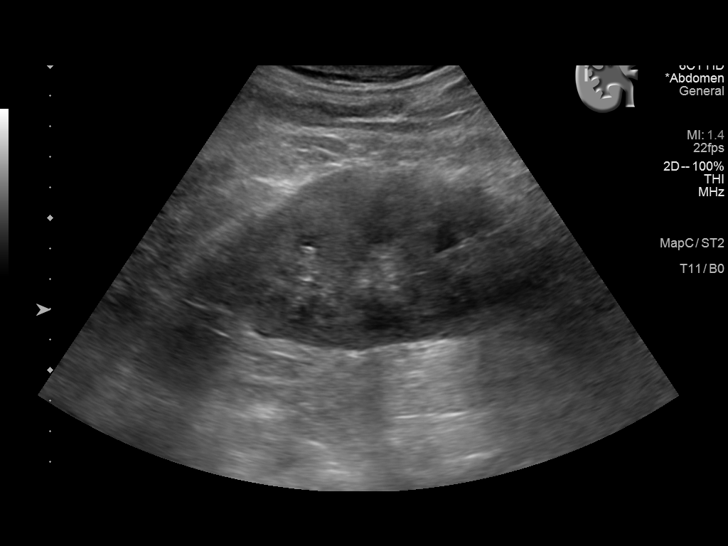
[im 69/104]
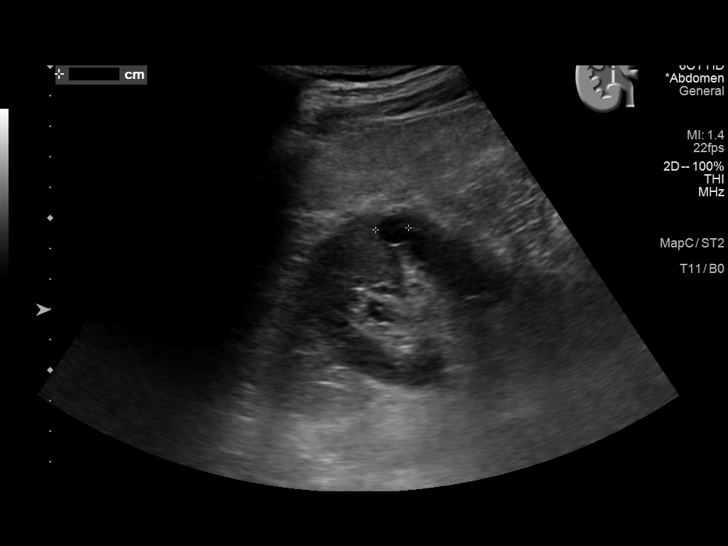
[im 78/104]
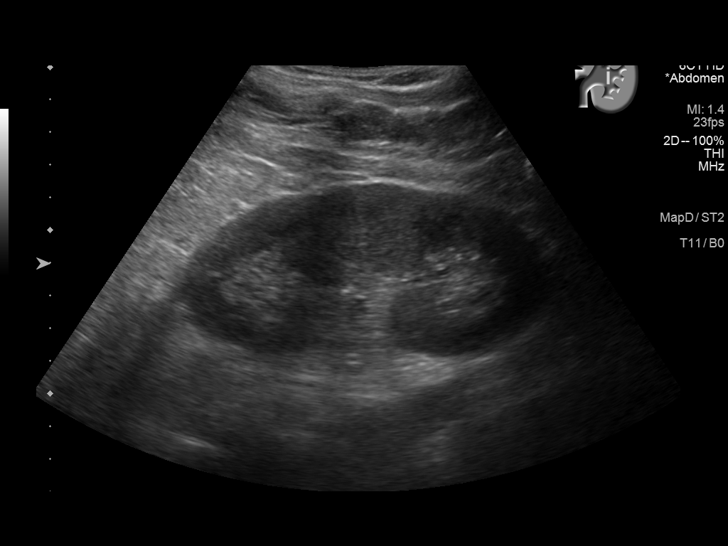
[im 86/104]
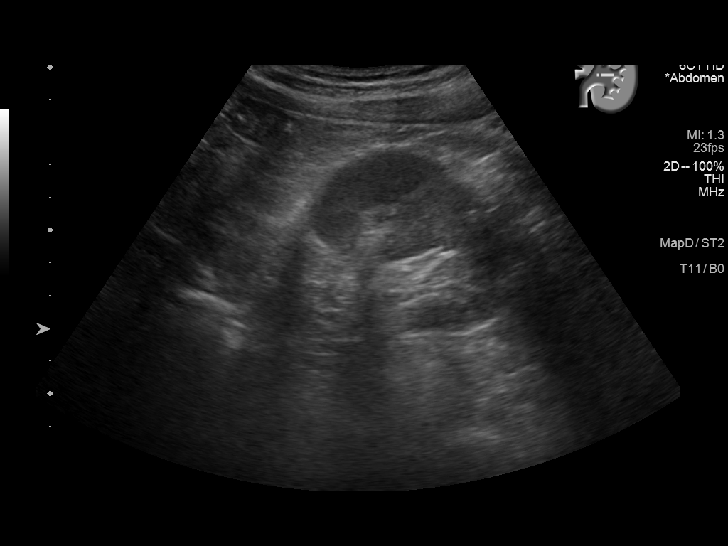
[im 95/104]
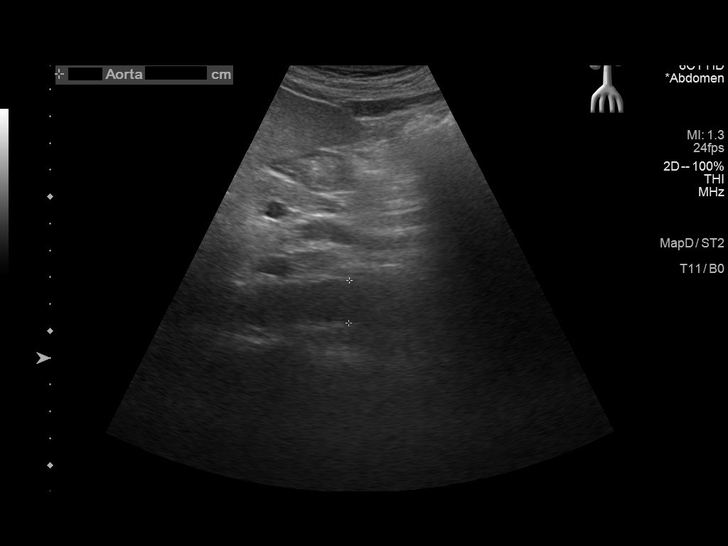
[im 104/104]
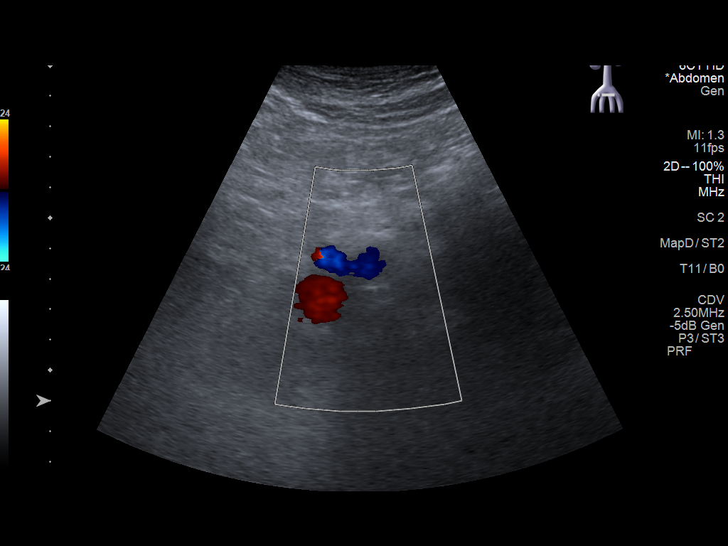

[14 of 25 positions shown; findings below may reference images not displayed]

FINDINGS: Gallbladder: No gallstones or wall thickening visualized. No
sonographic Murphy sign noted by sonographer.

Common bile duct: Diameter: 4 mm

Liver: Diffusely increased in echogenicity. No focal lesion is
identified. Portal vein is patent on color Doppler imaging with
normal direction of blood flow towards the liver.

IVC: No abnormality visualized.

Pancreas: Visualized portion unremarkable.

Spleen: Size and appearance within normal limits.

Right Kidney: Length: 12.6 cm. Normal renal cortical thickness and
echogenicity. No hydronephrosis. There are at least 3 echogenic
stones within the right renal collecting system measuring up to 13
mm. Multiple small hypoechoic lesions favored to represent cysts
however incompletely characterized.

Left Kidney: Length: 11.6 cm. Echogenicity within normal limits. No
mass or hydronephrosis visualized.

Abdominal aorta: No aneurysm visualized.

Other findings: None.
IMPRESSION: 1. Hepatic steatosis.
2. Right-sided nephrolithiasis.

## 2022-03-01 ENCOUNTER — Ambulatory Visit: Payer: Medicare HMO | Admitting: Urology

## 2022-03-01 ENCOUNTER — Encounter: Payer: Self-pay | Admitting: Urology

## 2022-03-01 VITALS — BP 149/88 | HR 93 | Ht 66.0 in | Wt 160.0 lb

## 2022-03-01 DIAGNOSIS — R3129 Other microscopic hematuria: Secondary | ICD-10-CM

## 2022-03-01 DIAGNOSIS — N401 Enlarged prostate with lower urinary tract symptoms: Secondary | ICD-10-CM | POA: Diagnosis not present

## 2022-03-01 DIAGNOSIS — Z125 Encounter for screening for malignant neoplasm of prostate: Secondary | ICD-10-CM

## 2022-03-01 DIAGNOSIS — N400 Enlarged prostate without lower urinary tract symptoms: Secondary | ICD-10-CM

## 2022-03-01 LAB — MICROSCOPIC EXAMINATION

## 2022-03-01 LAB — URINALYSIS, COMPLETE
Bilirubin, UA: NEGATIVE
Ketones, UA: NEGATIVE
Leukocytes,UA: NEGATIVE
Nitrite, UA: NEGATIVE
Specific Gravity, UA: 1.015 (ref 1.005–1.030)
Urobilinogen, Ur: 0.2 mg/dL (ref 0.2–1.0)
pH, UA: 5 (ref 5.0–7.5)

## 2022-03-01 LAB — BLADDER SCAN AMB NON-IMAGING: Scan Result: 25

## 2022-03-01 NOTE — Progress Notes (Signed)
03/01/2022 11:00 AM   Brendan West [birth year] [id number]  Referring provider: Albina Billet, MD [address]   [place],  [place] [address]  Chief Complaint  Patient presents with   Benign Prostatic Hypertrophy    HPI: Brendan West is a 67 y.o. male previously seen by Dr. Yves West for an elevated PSA who presents to continue urologic care.  He was followed by Dr. Yves West for BPH and erectile dysfunction.  On finasteride and sildenafil He last saw Dr. Yves West March 2023 and PSA was 1.8 Prior TRUS/biopsy performed 2013 for uncorrected PSA of 3.8 with benign pathology Prior history of stone disease Presently has no bothersome lower urinary tract symptoms and remains on finasteride IPSS 10/35 with QoL rated 1/0-6 scale Sildenafil remains effective Denies flank, abdominal pain or gross hematuria   PMH: Past Medical History:  Diagnosis Date   Colon polyp    Diabetes mellitus without complication ([place])    History of kidney stones    Hypertension    2008   Prostate enlargement     Surgical History: Past Surgical History:  Procedure Laterality Date   COLONOSCOPY  11/22/2012   Diverticulosis   COLONOSCOPY W/ BIOPSIES AND POLYPECTOMY  03/14/2007   8 mm tubular adenoma without atypica in the proximal ascending colon resected & retrieved. Diverticulosis sigmoid colon. Family history of adenometous polyps   COLONOSCOPY WITH PROPOFOL N/A 12/07/2017   Procedure: COLONOSCOPY WITH PROPOFOL;  Surgeon: Brendan Bellow, MD;  Location: [facility] ENDOSCOPY;  Service: Endoscopy;  Laterality: N/A;    Home Medications:  Allergies as of 03/01/2022   No Known Allergies      Medication List        Accurate as of March 01, 2022 11:00 AM. If you have any questions, ask your nurse or doctor.          finasteride 5 MG tablet Commonly known as: PROSCAR Take 5 mg by mouth daily.   Jardiance 10 MG Tabs tablet Generic drug: empagliflozin Take 10 mg by mouth daily.   metFORMIN 500  MG tablet Commonly known as: GLUCOPHAGE Take 500 mg by mouth 2 (two) times daily with a meal.   pravastatin 20 MG tablet Commonly known as: PRAVACHOL Take 20 mg by mouth daily.   sildenafil 20 MG tablet Commonly known as: REVATIO Take 20 mg by mouth 3 (three) times daily.   valsartan-hydrochlorothiazide 160-12.5 MG tablet Commonly known as: DIOVAN-HCT Take 1 tablet by mouth daily.        Allergies: No Known Allergies  Family History: Family History  Problem Relation Age of Onset   Colon polyps Brother        2008    Social History:  reports that he has never smoked. He has never used smokeless tobacco. He reports current alcohol use. He reports that he does not use drugs.   Physical Exam: BP (!) 149/88   Pulse 93   Ht 5' 6"$  (1.676 m)   Wt 160 lb (72.6 kg)   BMI 25.82 kg/m   Constitutional:  Alert and oriented, No acute distress. HEENT:  AT Respiratory: Normal respiratory effort, no increased work of breathing. GU: Prostate 30 g, smooth without nodules Skin: No rashes, bruises or suspicious lesions. Psychiatric: Normal mood and affect.  Laboratory Data:  Urinalysis Dipstick 3+ glucose/2+ blood/1+ protein Microscopy 11-30 RBC   Assessment & Plan:    1.  BPH with LUTS Moderate LUTS stable on finasteride.  He did not need a refill  2.  Erectile dysfunction Stable on sildenafil  3.  History abnormal PSA velocity Prior benign biopsy PSA drawn today  4.  Microhematuria New problem AUA hematuria risk stratification: High based on age We discussed the recommended evaluation of high risk hematuria which include CT urogram and cystoscopy.  The procedures were discussed in detail and he has elected to pursue further evaluation + hx stone disease Serum creatinine drawn He wanted to hold off on scheduling cystoscopy until CT performed   Brendan Sons, MD  [facility] [address], [place], [place] [address] [phone]

## 2022-03-02 ENCOUNTER — Encounter: Payer: Self-pay | Admitting: Urology

## 2022-03-02 LAB — PSA: Prostate Specific Ag, Serum: 1.6 ng/mL (ref 0.0–4.0)

## 2022-03-02 LAB — CREATININE, SERUM
Creatinine, Ser: 1.3 mg/dL — ABNORMAL HIGH (ref 0.76–1.27)
eGFR: 61 mL/min/{1.73_m2} (ref >59)

## 2022-03-05 ENCOUNTER — Encounter: Payer: Self-pay | Admitting: *Deleted

## 2022-03-19 ENCOUNTER — Ambulatory Visit
Admission: RE | Admit: 2022-03-19 | Discharge: 2022-03-19 | Disposition: A | Discharge status: Home / Self Care | Payer: Medicare HMO | Source: Ambulatory Visit | Attending: Urology | Admitting: Urology

## 2022-03-19 DIAGNOSIS — R3129 Other microscopic hematuria: Secondary | ICD-10-CM | POA: Diagnosis not present

## 2022-03-19 MED ORDER — IOHEXOL 300 MG/ML  SOLN
100.0000 mL | Freq: Once | INTRAMUSCULAR | Status: AC | PRN
  Administered 2022-03-19: 100 mL via INTRAVENOUS

## 2022-03-19 MED ORDER — IOHEXOL 350 MG/ML SOLN: 100.0000 mL | Freq: Once | INTRAVENOUS | Status: DC | PRN

## 2022-03-25 ENCOUNTER — Encounter: Payer: Self-pay | Admitting: Urology

## 2022-03-31 ENCOUNTER — Encounter: Payer: Self-pay | Admitting: Urology

## 2022-03-31 ENCOUNTER — Ambulatory Visit: Payer: Medicare HMO | Admitting: Urology

## 2022-03-31 VITALS — BP 150/86 | HR 86 | Ht 66.0 in | Wt 155.0 lb

## 2022-03-31 DIAGNOSIS — N200 Calculus of kidney: Secondary | ICD-10-CM | POA: Diagnosis not present

## 2022-03-31 DIAGNOSIS — R3129 Other microscopic hematuria: Secondary | ICD-10-CM | POA: Diagnosis not present

## 2022-03-31 NOTE — Patient Instructions (Signed)
Percutaneous Nephrolithotomy Percutaneous nephrolithotomy is a procedure to remove kidney stones. Kidney stones are rock-like masses that form inside the kidneys. You may need this procedure if: You have kidney stones that are bigger than 2 cm (0.78 inch) wide. Your kidney stones have an odd shape to them. Other treatments have not worked. You have an infection brought on by the kidney stones. Tell a health care provider about: Any allergies you have. All medicines you are taking, including vitamins, herbs, eye drops, creams, and over-the-counter medicines. Any problems you or family members have had with anesthesia. Any bleeding problems you have. Any surgeries you have had. Any medical conditions you have. Whether you are pregnant or may be pregnant. What are the risks? Your health care provider will talk with you about risks. These may include: Infection. Bleeding. This may include blood in your pee (urine). Allergic reactions to medicines. Damage to other structures or organs. Damage to the kidney. Holes in the kidney. In most cases, these holes heal on their own. Numbness or tingling. Not being able to remove all the stones. If this happens, you may need more treatment. What happens before the procedure? When to stop eating and drinking Follow instructions from your provider about what you may eat and drink. These may include: 8 hours before your procedure Stop eating most foods. Do not eat meat, fried foods, or fatty foods. Eat only light foods, such as toast or crackers. All liquids are okay except energy drinks and alcohol. 6 hours before your procedure Stop eating. Drink only clear liquids, such as water, clear fruit juice, black coffee, plain tea, and sports drinks. Do not drink energy drinks or alcohol. 2 hours before your procedure Stop drinking all liquids. You may be allowed to take medicines with small sips of water. If you do not follow your provider's  instructions, your procedure may be delayed or canceled. Medicines Ask your provider about: Changing or stopping your regular medicines. These include any diabetes medicines or blood thinners you take. Taking medicines such as aspirin and ibuprofen. These medicines can thin your blood. Do not take them unless your provider tells you to. Taking over-the-counter medicines, vitamins, herbs, and supplements. Tests You may have tests done. These may include: Blood and pee tests. An electrocardiogram (EKG). This checks how well your heart is working. Imaging studies. These may include an ultrasound or CT scan. They can help find: The size and number (stone burden) of the kidney stones. Where the kidney stones are. Surgery safety Ask your provider: How your surgery site will be marked. What steps will be taken to help prevent infection. These steps may include: Removing hair at the surgery site. Washing skin with a soap that kills germs. Receiving antibiotics. General instructions Plan to have a responsible adult: Take you home from the hospital. You will not be allowed to drive. Care for you for the time you are told. What happens during the procedure?  An IV will be inserted into one of your veins. You will be given: A sedative. This helps you relax. Anesthesia. This keeps you from feeling pain. It will numb certain areas make you fall asleep for surgery. A soft tube (catheter) will be put in your bladder. This will drain pee out of your body during and after the procedure. Your surgeon will make a small incision in your lower back. A tube will be put through the incision into your kidney. Each kidney stone will be removed through this tube. Larger stones may  need to be broken up with a beam of light (laser) or other tools. After all of the stones have been removed, your provider may put in another tube to help drain pee. This may be: A stent. This is put into the tube that connects the  bladder to the kidney (ureter). It helps drain pee from your kidney to your bladder. A nephrostomy tube. This is put in your kidney. It comes out through the incision in your lower back. It helps drain pee or any fluid that builds up while your kidney heals. The incision may be closed with stitches (sutures). A bandage (dressing) will be placed over the incision area. The procedure may vary among providers and hospitals. What happens after the procedure? Your blood pressure, heart rate, breathing rate, and blood oxygen level will be monitored until you leave the hospital or clinic. Any tubes will be removed after 1-2 days if there is only a small amount of blood in your pee. This information is not intended to replace advice given to you by your health care provider. Make sure you discuss any questions you have with your health care provider. Document Revised: 09/10/2021 Document Reviewed: 09/10/2021 Elsevier Patient Education  [employer].

## 2022-03-31 NOTE — Progress Notes (Signed)
I, Brendan West,acting as a scribe for Brendan Espy, MD.,have documented all relevant documentation on the behalf of Brendan Espy, MD,as directed by  Brendan Espy, MD while in the presence of Brendan Espy, MD.   03/31/22 3:09 PM   Brendan West Jul 25, 1955 [id number]  Referring provider: Albina Billet, MD [address]   [place],  [place] [address]  Chief Complaint  Patient presents with   Nephrolithiasis    HPI: 67 year-old male, previously under the care of Dr. Eliberto Ivory who initially presented to Dr. Bernardo Heater to establish care due to a personal history of BPH, erectile dysfunction, and a known history of microscopic hematuria. Classified as high risk, a CT urogram was performed, revealing a large right renal staghorn calculus without evidence of obstruction. He presents to discuss the management of this renal calculus.   He has a history of kidney stones, with the last episode occurring a couple of years ago, which was managed by passing the stone without the need for surgical intervention. No current pain or symptoms related to the kidney stone. No recent urinary tract or kidney infections. He has a planned trip to [place] and has opted to schedule the procedure upon his return.  PMH: Past Medical History:  Diagnosis Date   Colon polyp    Diabetes mellitus without complication ([facility])    History of kidney stones    Hypertension    2008   Prostate enlargement     Surgical History: Past Surgical History:  Procedure Laterality Date   COLONOSCOPY  11/22/2012   Diverticulosis   COLONOSCOPY W/ BIOPSIES AND POLYPECTOMY  03/14/2007   8 mm tubular adenoma without atypica in the proximal ascending colon resected & retrieved. Diverticulosis sigmoid colon. Family history of adenometous polyps   COLONOSCOPY WITH PROPOFOL N/A 12/07/2017   Procedure: COLONOSCOPY WITH PROPOFOL;  Surgeon: Robert Bellow, MD;  Location: [facility] ENDOSCOPY;  Service: Endoscopy;  Laterality: N/A;     Home Medications:  Allergies as of 03/31/2022   No Known Allergies      Medication List        Accurate as of March 31, 2022  3:09 PM. If you have any questions, ask your nurse or doctor.          Accu-Chek Guide test strip Generic drug: glucose blood   finasteride 5 MG tablet Commonly known as: PROSCAR Take 5 mg by mouth daily.   Jardiance 10 MG Tabs tablet Generic drug: empagliflozin Take 10 mg by mouth daily.   metFORMIN 500 MG tablet Commonly known as: GLUCOPHAGE Take 500 mg by mouth 2 (two) times daily with a meal.   pravastatin 20 MG tablet Commonly known as: PRAVACHOL Take 20 mg by mouth daily.   sildenafil 20 MG tablet Commonly known as: REVATIO Take 20 mg by mouth 3 (three) times daily.   valsartan-hydrochlorothiazide 160-12.5 MG tablet Commonly known as: DIOVAN-HCT Take 1 tablet by mouth daily.        Family History: Family History  Problem Relation Age of Onset   Colon polyps Brother        2008    Social History:  reports that he has never smoked. He has never used smokeless tobacco. He reports current alcohol use. He reports that he does not use drugs.   Physical Exam: BP (!) 150/86   Pulse 86   Ht '5\' 6"'$  (1.676 m)   Wt 155 lb (70.3 kg)   BMI 25.02 kg/m   Constitutional:  Alert and oriented, No acute distress. HEENT: [place] AT, moist mucus membranes.  Trachea midline, no masses. Neurologic: Grossly intact, no focal deficits, moving all 4 extremities. Psychiatric: Normal mood and affect.  Pertinent Imaging:  Results for orders placed during the hospital encounter of 03/19/22  CT HEMATURIA WORKUP  Narrative CLINICAL DATA:  Microscopic hematuria.  EXAM: CT ABDOMEN AND PELVIS WITHOUT AND WITH CONTRAST  TECHNIQUE: Multidetector CT imaging of the abdomen and pelvis was performed following the standard protocol before and following the bolus administration of intravenous contrast.  RADIATION DOSE REDUCTION: This exam was  performed according to the departmental dose-optimization program which includes automated exposure control, adjustment of the mA and/or kV according to patient size and/or use of iterative reconstruction technique.  CONTRAST:  181m OMNIPAQUE IOHEXOL 300 MG/ML  SOLN  COMPARISON:  Noncontrast CT on 04/07/2013  FINDINGS: Lower Chest: No acute findings.  Hepatobiliary: Increased since previous study. Tiny sub-cm cyst noted in the posterior hepatic lobe. No hepatic masses identified. Gallbladder is unremarkable. No evidence of biliary ductal dilatation.  Pancreas:  No mass or inflammatory changes.  Spleen: Within normal limits in size and appearance.  Adrenals/Urinary Tract: No adrenal masses identified. Staghorn calculus seen throughout the right renal collecting system. No evidence of ureteral calculi or hydronephrosis. No suspicious renal masses identified. No masses seen involving the collecting systems, ureters, or bladder.  Stomach/Bowel: No evidence of obstruction, inflammatory process or abnormal fluid collections. Normal appendix visualized. Diverticulosis is seen mainly involving the descending and sigmoid colon, however there is no evidence of diverticulitis.  Vascular/Lymphatic: No pathologically enlarged lymph nodes. No acute vascular findings.  Reproductive:  No mass or other significant abnormality.  Other:  None.  Musculoskeletal:  No suspicious bone lesions identified.  IMPRESSION: Right renal staghorn calculus. No evidence of ureteral calculi or hydronephrosis.  No radiographic evidence of urinary tract neoplasm.  Colonic diverticulosis, without radiographic evidence of diverticulitis.   Electronically Signed By: JMarlaine HindM.D. On: 03/19/2022 14:11  Personally reviewed and agree with radiologic interpretation.   Assessment & Plan:     Large right renal staghorn calculus - We discussed various treatment options for urolithiasis including  observation with or without medical expulsive therapy, shockwave lithotripsy (SWL), ureteroscopy and laser lithotripsy with stent placement, and percutaneous nephrolithotomy.   We discussed that management is based on stone size, location, density, patient co-morbidities, and patient preference.    Stones <57min size have a >80% spontaneous passage rate. Data surrounding the use of tamsulosin for medical expulsive therapy is controversial, but meta analyses suggests it is most efficacious for distal stones between 5-1044mn size. Possible side effects include dizziness/lightheadedness, and retrograde ejaculation.   SWL has a lower stone free rate in a single procedure, but also a lower complication rate compared to ureteroscopy and avoids a stent and associated stent related symptoms. Possible complications include renal hematoma, steinstrasse, and need for additional treatment. We discussed the role of his increased skin to stone distance can lead to decreased efficacy with shockwave lithotripsy.   Ureteroscopy with laser lithotripsy and stent placement has a higher stone free rate than SWL in a single procedure, however increased complication rate including possible infection, ureteral injury, bleeding, and stent related morbidity. Common stent related symptoms include dysuria, urgency/frequency, and flank pain.   After an extensive discussion of the risks and benefits of the above treatment options, the patient would like to proceed with right PCNL [place]  to be collected closer to the procedure date.  2. History of microscopic hematuria - Likely related to the large renal calculus. Plan to re-evaluate hematuria post-procedure.  Return for right PCNL when next available.   [facility] [address] [place], [place] [address] [phone]

## 2022-04-01 ENCOUNTER — Other Ambulatory Visit: Payer: Self-pay | Admitting: Urology

## 2022-04-01 DIAGNOSIS — N200 Calculus of kidney: Secondary | ICD-10-CM

## 2022-04-01 NOTE — Progress Notes (Signed)
Surgical Physician Order Form [facility]  * Scheduling expectation : Next Available (Mr. Randles indicated that he will be traveling to [place], please schedule for after when he returns)  *Length of Case:   *Clearance needed: no  *Anticoagulation Instructions: Hold all anticoagulants  *Aspirin Instructions: Hold Aspirin  *Post-op visit Date/Instructions:   TBD  *Diagnosis: Right Nephrolithiasis  *Procedure: right PCNL [id number])   Additional orders: N/A  -Admit type: ? Obs  -Anesthesia: General  -VTE Prophylaxis Standing Order SCD's       Other:   -Standing Lab Orders Per Anesthesia    Lab other: UA&Urine Culture; CBC' CMP; INR; T&S  -Standing Test orders EKG/Chest x-ray per Anesthesia       Test other:   - Medications:  Ancef 2gm IV  -Other orders:   Please ensure trilogy rep is present

## 2022-04-09 ENCOUNTER — Other Ambulatory Visit: Payer: Self-pay | Admitting: Urology

## 2022-04-09 DIAGNOSIS — N200 Calculus of kidney: Secondary | ICD-10-CM

## 2022-04-12 ENCOUNTER — Other Ambulatory Visit: Payer: Self-pay

## 2022-04-12 MED ORDER — FINASTERIDE 5 MG PO TABS: 5.0000 mg | ORAL_TABLET | Freq: Every day | ORAL | 3 refills | Status: DC

## 2022-04-13 ENCOUNTER — Telehealth: Payer: Self-pay

## 2022-04-13 NOTE — Telephone Encounter (Signed)
I spoke with Brendan West. We have discussed possible surgery dates and Monday April 29th, 2024 was agreed upon by all parties. Patient given information about surgery date, what to expect pre-operatively and post operatively.  We discussed that a Pre-Admission Testing office will be calling to set up the pre-op visit that will take place prior to surgery, and that these appointments are typically done over the phone with a Pre-Admissions RN. Informed patient that our office will communicate any additional care to be provided after surgery. Patients questions or concerns were discussed during our call. Advised to call our office should there be any additional information, questions or concerns that arise. Patient verbalized understanding.

## 2022-04-13 NOTE — Progress Notes (Addendum)
   [facility] From  Surgery Date: 07/05/2022   Surgeon: Dr. Vanna Scotland, MD  Inpt ( No  )   Outpt (No)   Obs ( Yes  )   Diagnosis: N20.0 Right Nephrolithiasis  -CPT: 50080  Surgery: Right Percutaneous Nephrolithotomy  Stop Anticoagulations: Yes and should also hold ASA  Cardiac/Medical/Pulmonary Clearance needed: no  *Orders entered into EPIC  Date: 04/13/22   *Case booked in EPIC  Date: 04/09/2022  *Notified pt of Surgery: Date: 04/09/2022  PRE-OP UA & CX: yes and will also obtain CBC, CMP, INR, Type and Screen  *Placed into Prior Authorization Work [facility] Date: 04/13/22  Assistant/laser/rep:Yes, The [employer] Rep [place]un[place]) with [employer] will also be present

## 2022-05-13 ENCOUNTER — Inpatient Hospital Stay: Admission: RE | Admit: 2022-05-13 | Payer: Medicare HMO | Source: Ambulatory Visit

## 2022-05-13 NOTE — Telephone Encounter (Signed)
Spoke with Brendan West on 05/12/2022. We will have to move his surgery to 07/05/2022 due to the [facility] that helps Dr. Apolinar Junes was injured and will be out for 6 weeks. Dr. Apolinar Junes needs the assistant of a technician who is out on FMLA. She feels more comfortable proceeding with this assistant is available for technical support and equipment issues. She only wants the safest most optimal experience for the patient. Brendan West verbalized understanding. Case Re-schedule, IR has also been updated and the OR is aware. Patient encouraged to call with any questions or concerns.

## 2022-05-24 ENCOUNTER — Ambulatory Visit: Payer: Medicare HMO | Admitting: Radiology

## 2022-06-25 ENCOUNTER — Encounter
Admission: RE | Admit: 2022-06-25 | Discharge: 2022-06-25 | Disposition: A | Discharge status: Home / Self Care | Payer: Medicare HMO | Source: Ambulatory Visit | Attending: Urology | Admitting: Urology

## 2022-06-25 ENCOUNTER — Other Ambulatory Visit: Payer: Self-pay

## 2022-06-25 VITALS — Ht 65.0 in | Wt 157.0 lb

## 2022-06-25 DIAGNOSIS — I10 Essential (primary) hypertension: Secondary | ICD-10-CM

## 2022-06-25 DIAGNOSIS — E119 Type 2 diabetes mellitus without complications: Secondary | ICD-10-CM

## 2022-06-25 NOTE — Patient Instructions (Addendum)
Your procedure is scheduled on: Monday, July 05, 2022 Report to the Registration Desk on the 1st floor of the [facility] To find out your arrival time, please call [phone] between 1PM - 3PM on: Friday, July 02, 2022 If your arrival time is 6:00 am, do not arrive before that time as the [facility] entrance doors do not open until 6:00 am.  REMEMBER: Instructions that are not followed completely may result in serious medical risk, up to and including death; or upon the discretion of your surgeon and anesthesiologist your surgery may need to be rescheduled.  Do not eat food after midnight the night before surgery.  No gum chewing or hard candies.   One week prior to surgery: Stop Anti-inflammatories (NSAIDS) such as Advil, Aleve, Ibuprofen, Motrin, Naproxen, Naprosyn and Aspirin based products such as Excedrin, Goody's Powder, BC Powder. Stop ANY OVER THE COUNTER supplements until after surgery. You may however, continue to take Tylenol if needed for pain up until the day of surgery.  Continue taking all prescribed medications with the exception of the following:  - Stop taking empagliflozin (JARDIANCE) 10 MG TABS tablet 3 days prior to procedure. Last dose: July 01, 2022 - Stop taking metFORMIN (GLUCOPHAGE) 500 MG tablet  2 days prior to procedure. Last dose: July 02, 2022   TAKE ONLY THESE MEDICATIONS THE MORNING OF SURGERY WITH A SIP OF WATER:  pravastatin (PRAVACHOL) 20 MG tablet   Do not take sildenafil (REVATIO) 20 MG tablet for 2 days prior to surgery.   No Alcohol for 24 hours before or after surgery.  No Smoking including e-cigarettes for 24 hours before surgery.  No chewable tobacco products for at least 6 hours before surgery.  No nicotine patches on the day of surgery.  Do not use any "recreational" drugs for at least a week (preferably 2 weeks) before your surgery.  Please be advised that the combination of cocaine and anesthesia may have negative outcomes, up to  and including death. If you test positive for cocaine, your surgery will be cancelled.  On the morning of surgery brush your teeth with toothpaste and water, you may rinse your mouth with mouthwash if you wish. Do not swallow any toothpaste or mouthwash.  Use CHG Soap or wipes as directed on instruction sheet.  Do not wear jewelry, make-up, hairpins, clips or nail polish.  Do not wear lotions, powders, or perfumes.   Do not shave body hair from the neck down 48 hours before surgery.  Contact lenses, hearing aids and dentures may not be worn into surgery.  Do not bring valuables to the hospital. [facility] is not responsible for any missing/lost belongings or valuables.    Notify your doctor if there is any change in your medical condition (cold, fever, infection).  Wear comfortable clothing (specific to your surgery type) to the hospital.  After surgery, you can help prevent lung complications by doing breathing exercises.  Take deep breaths and cough every 1-2 hours. Your doctor may order a device called an Incentive Spirometer to help you take deep breaths. When coughing or sneezing, hold a pillow firmly against your incision with both hands. This is called "splinting." Doing this helps protect your incision. It also decreases belly discomfort.  If you are being admitted to the hospital overnight, leave your suitcase in the car. After surgery it may be brought to your room.  In case of increased patient census, it may be necessary for you, the patient, to continue  your postoperative care in the Same Day Surgery department.  If you are being discharged the day of surgery, you will not be allowed to drive home. You will need a responsible individual to drive you home and stay with you for 24 hours after surgery.   If you are taking public transportation, you will need to have a responsible individual with you.  Please call the Pre-admissions Testing Dept. at [phone] if you  have any questions about these instructions.  Surgery Visitation Policy:  Patients having surgery or a procedure may have two visitors.  Children under the age of 70 must have an adult with them who is not the patient.  Inpatient Visitation:    Visiting hours are 7 a.m. to 8 p.m. Up to four visitors are allowed at one time in a patient room. The visitors may rotate out with other people during the day.  One visitor age 24 or older may stay with the patient overnight and must be in the room by 8 p.m.     Preparing for Surgery with CHLORHEXIDINE GLUCONATE (CHG) Soap  Chlorhexidine Gluconate (CHG) Soap  o An antiseptic cleaner that kills germs and bonds with the skin to continue killing germs even after washing  o Used for showering the night before surgery and morning of surgery  Before surgery, you can play an important role by reducing the number of germs on your skin.  CHG (Chlorhexidine gluconate) soap is an antiseptic cleanser which kills germs and bonds with the skin to continue killing germs even after washing.  Please do not use if you have an allergy to CHG or antibacterial soaps. If your skin becomes reddened/irritated stop using the CHG.  1. Shower the NIGHT BEFORE SURGERY and the MORNING OF SURGERY with CHG soap.  2. If you choose to wash your hair, wash your hair first as usual with your normal shampoo.  3. After shampooing, rinse your hair and body thoroughly to remove the shampoo.  4. Use CHG as you would any other liquid soap. You can apply CHG directly to the skin and wash gently with a scrungie or a clean washcloth.  5. Apply the CHG soap to your body only from the neck down. Do not use on open wounds or open sores. Avoid contact with your eyes, ears, mouth, and genitals (private parts). Wash face and genitals (private parts) with your normal soap.  6. Wash thoroughly, paying special attention to the area where your surgery will be performed.  7. Thoroughly  rinse your body with warm water.  8. Do not shower/wash with your normal soap after using and rinsing off the CHG soap.  9. Pat yourself dry with a clean towel.  10. Wear clean pajamas to bed the night before surgery.  12. Place clean sheets on your bed the night of your first shower and do not sleep with pets.  13. Shower again with the CHG soap on the day of surgery prior to arriving at the hospital.  14. Do not apply any deodorants/lotions/powders.  15. Please wear clean clothes to the hospital.

## 2022-06-29 ENCOUNTER — Encounter: Payer: Self-pay | Admitting: Urgent Care

## 2022-06-29 ENCOUNTER — Encounter
Admission: RE | Admit: 2022-06-29 | Discharge: 2022-06-29 | Disposition: A | Discharge status: Home / Self Care | Payer: Medicare HMO | Source: Ambulatory Visit | Attending: Urology | Admitting: Urology

## 2022-06-29 ENCOUNTER — Encounter: Payer: Self-pay | Admitting: Radiology

## 2022-06-29 DIAGNOSIS — N200 Calculus of kidney: Secondary | ICD-10-CM | POA: Insufficient documentation

## 2022-06-29 DIAGNOSIS — I10 Essential (primary) hypertension: Secondary | ICD-10-CM | POA: Diagnosis not present

## 2022-06-29 DIAGNOSIS — Z01818 Encounter for other preprocedural examination: Secondary | ICD-10-CM | POA: Insufficient documentation

## 2022-06-29 LAB — TYPE AND SCREEN
ABO/RH(D): O POS
Antibody Screen: NEGATIVE

## 2022-06-29 LAB — URINALYSIS, COMPLETE (UACMP) WITH MICROSCOPIC
Bacteria, UA: NONE SEEN
Bilirubin Urine: NEGATIVE
Glucose, UA: >=500 mg/dL — AB
Ketones, ur: NEGATIVE mg/dL
Leukocytes,Ua: NEGATIVE
Nitrite: NEGATIVE
Protein, ur: NEGATIVE mg/dL
Specific Gravity, Urine: 1.02 (ref 1.005–1.030)
Squamous Epithelial / HPF: NONE SEEN /HPF (ref 0–5)
pH: 5 (ref 5.0–8.0)

## 2022-06-29 LAB — CBC
HCT: 40.7 % (ref 39.0–52.0)
Hemoglobin: 13.6 g/dL (ref 13.0–17.0)
MCH: 29.3 pg (ref 26.0–34.0)
MCHC: 33.4 g/dL (ref 30.0–36.0)
MCV: 87.7 fL (ref 80.0–100.0)
Platelets: 113 10*3/uL — ABNORMAL LOW (ref 150–400)
RBC: 4.64 MIL/uL (ref 4.22–5.81)
RDW: 14.6 % (ref 11.5–15.5)
WBC: 5.8 10*3/uL (ref 4.0–10.5)
nRBC: 0 % (ref 0.0–0.2)

## 2022-06-29 LAB — COMPREHENSIVE METABOLIC PANEL
ALT: 82 U/L — ABNORMAL HIGH (ref 0–44)
AST: 83 U/L — ABNORMAL HIGH (ref 15–41)
Albumin: 4.2 g/dL (ref 3.5–5.0)
Alkaline Phosphatase: 62 U/L (ref 38–126)
Anion gap: 8 (ref 5–15)
BUN: 19 mg/dL (ref 8–23)
CO2: 25 mmol/L (ref 22–32)
Calcium: 9 mg/dL (ref 8.9–10.3)
Chloride: 103 mmol/L (ref 98–111)
Creatinine, Ser: 1.13 mg/dL (ref 0.61–1.24)
GFR, Estimated: >60 mL/min (ref >60)
Glucose, Bld: 124 mg/dL — ABNORMAL HIGH (ref 70–99)
Potassium: 3.8 mmol/L (ref 3.5–5.1)
Sodium: 136 mmol/L (ref 135–145)
Total Bilirubin: 0.9 mg/dL (ref 0.3–1.2)
Total Protein: 7.7 g/dL (ref 6.5–8.1)

## 2022-06-29 LAB — PROTIME-INR
INR: 1.1 (ref 0.8–1.2)
Prothrombin Time: 14.5 seconds (ref 11.4–15.2)

## 2022-06-30 LAB — URINE CULTURE: Culture: NO GROWTH

## 2022-07-02 ENCOUNTER — Other Ambulatory Visit: Payer: Self-pay | Admitting: Student

## 2022-07-02 DIAGNOSIS — Z01812 Encounter for preprocedural laboratory examination: Secondary | ICD-10-CM

## 2022-07-02 DIAGNOSIS — N200 Calculus of kidney: Secondary | ICD-10-CM

## 2022-07-04 MED ORDER — ORAL CARE MOUTH RINSE: 15.0000 mL | Freq: Once | OROMUCOSAL | Status: AC

## 2022-07-04 MED ORDER — FAMOTIDINE 20 MG PO TABS
20.0000 mg | ORAL_TABLET | Freq: Once | ORAL | Status: AC
  Administered 2022-07-05: 20 mg via ORAL

## 2022-07-04 MED ORDER — CHLORHEXIDINE GLUCONATE 0.12 % MT SOLN
15.0000 mL | Freq: Once | OROMUCOSAL | Status: AC
  Administered 2022-07-05: 15 mL via OROMUCOSAL

## 2022-07-04 MED ORDER — CEFAZOLIN SODIUM-DEXTROSE 2-4 GM/100ML-% IV SOLN
2.0000 g | INTRAVENOUS | Status: AC
  Administered 2022-07-05: 2 g via INTRAVENOUS

## 2022-07-04 MED ORDER — SODIUM CHLORIDE 0.9 % IV SOLN: INTRAVENOUS | Status: DC

## 2022-07-04 MED ORDER — SODIUM CHLORIDE 0.9 % IV SOLN
2.0000 g | INTRAVENOUS | Status: DC
  Filled 2022-07-04 (×2): qty 20

## 2022-07-04 NOTE — H&P (Signed)
Chief Complaint: Right kidney with Staghorn calculi. Request is for right sided nephrostomy tube placement for percutaneous nephrolithotomy access.   Referring Physician(s): Vanna Scotland  Supervising Physician: Pernell Dupre  Patient Status: [facility] - Out-pt  History of Present Illness: Brendan West is a 67 y.o. male outpatient. History of DM, HTN,  BPH, RD. Found to have microscopic hematuria. CT Urogram from 2.23.24 reads Right renal staghorn calculus. No evidence of ureteral calculi or Hydronephrosis. Team is requesting a right sided nephrostomy tube for percutaneous nephrolithotomy access.   Currently without any significant complaints. Patient alert and laying in bed,calm. Denies any fevers, headache, chest pain, SOB, cough, abdominal pain, nausea, vomiting or bleeding. Return precautions and treatment recommendations and follow-up discussed with the patient  who is agreeable with the plan.    Past Medical History:  Diagnosis Date   Colon polyp    Diabetes mellitus without complication ([facility])    History of kidney stones    Hypertension    2008   Prostate enlargement     Past Surgical History:  Procedure Laterality Date   COLONOSCOPY  11/22/2012   Diverticulosis   COLONOSCOPY W/ BIOPSIES AND POLYPECTOMY  03/14/2007   8 mm tubular adenoma without atypica in the proximal ascending colon resected & retrieved. Diverticulosis sigmoid colon. Family history of adenometous polyps   COLONOSCOPY WITH PROPOFOL N/A 12/07/2017   Procedure: COLONOSCOPY WITH PROPOFOL;  Surgeon: [name], MD;  Location: [facility] ENDOSCOPY;  Service: Endoscopy;  Laterality: N/A;    Allergies: Patient has no known allergies.  Medications: Prior to Admission medications   Medication Sig Start Date End Date Taking? Authorizing Provider  ACCU-CHEK GUIDE test strip  03/17/22   [provider]  empagliflozin (JARDIANCE) 10 MG TABS tablet Take 10 mg by mouth daily.    [provider]  finasteride (PROSCAR) 5 MG tablet Take 1 tablet (5 mg total) by mouth daily. 04/12/22   Stoioff, Verna Czech, MD  glipiZIDE (GLUCOTROL XL) 2.5 MG 24 hr tablet Take 2.5 mg by mouth daily with breakfast.    [provider]  metFORMIN (GLUCOPHAGE) 500 MG tablet Take 500 mg by mouth 2 (two) times daily with a meal.    [provider]  pravastatin (PRAVACHOL) 20 MG tablet Take 20 mg by mouth daily. 01/05/22   [provider]  sildenafil (REVATIO) 20 MG tablet Take 20 mg by mouth as needed.    [provider]  valsartan-hydrochlorothiazide (DIOVAN-HCT) 160-12.5 MG per tablet Take 1 tablet by mouth daily.    [provider]     Family History  Problem Relation Age of Onset   Colon polyps Brother        2008    Social History   Socioeconomic History   Marital status: Married    Spouse name: Not on file   Number of children: Not on file   Years of education: Not on file   Highest education level: Not on file  Occupational History   Not on file  Tobacco Use   Smoking status: Never   Smokeless tobacco: Never  Vaping Use   Vaping Use: Never used  Substance and Sexual Activity   Alcohol use: Yes    Comment: social   Drug use: Yes    Types: Marijuana    Comment: last time 07/02/22   Sexual activity: Not on file  Other Topics Concern   Not on file  Social History Narrative   Not on file  Social Determinants of Health   Financial Resource Strain: Not on file  Food Insecurity: Not on file  Transportation Needs: Not on file  Physical Activity: Not on file  Stress: Not on file  Social Connections: Not on file    Review of Systems: A 12 point ROS discussed and pertinent positives are indicated in the HPI above.  All other systems are negative.  Review of Systems  Constitutional:  Negative for fever.  HENT:  Negative for congestion.   Respiratory:  Negative for cough and shortness of breath.   Cardiovascular:  Negative for  chest pain.  Gastrointestinal:  Negative for abdominal pain.  Neurological:  Negative for headaches.  Psychiatric/Behavioral:  Negative for behavioral problems and confusion.     Vital Signs: 156/88. HR 79. RR 18, 98% on room air  Advance Care Plan: The advanced care plan/surrogate decision maker was discussed at the time of visit and documented in the medical record.  Wife   Physical Exam Vitals and nursing note reviewed.  Constitutional:      Appearance: He is well-developed.  HENT:     Head: Normocephalic.  Cardiovascular:     Rate and Rhythm: Normal rate and regular rhythm.     Heart sounds: Normal heart sounds.  Pulmonary:     Effort: Pulmonary effort is normal.     Breath sounds: Normal breath sounds.  Musculoskeletal:        General: Normal range of motion.     Cervical back: Normal range of motion.  Skin:    General: Skin is warm and dry.  Neurological:     General: No focal deficit present.     Mental Status: He is alert and oriented to person, place, and time.  Psychiatric:        Mood and Affect: Mood normal.        Behavior: Behavior normal.        Thought Content: Thought content normal.     Imaging: No results found.  Labs:  CBC: Recent Labs    06/29/22 1030  WBC 5.8  HGB 13.6  HCT 40.7  PLT 113*    COAGS: Recent Labs    06/29/22 1030  INR 1.1    BMP: Recent Labs    03/01/22 1133 06/29/22 1030  NA  --  136  K  --  3.8  CL  --  103  CO2  --  25  GLUCOSE  --  124*  BUN  --  19  CALCIUM  --  9.0  CREATININE 1.30* 1.13  GFRNONAA  --  >60    LIVER FUNCTION TESTS: Recent Labs    06/29/22 1030  BILITOT 0.9  AST 83*  ALT 82*  ALKPHOS 62  PROT 7.7  ALBUMIN 4.2   Assessment and Plan:  67 y.o. Male outpatient. History of DM, HTN,  BPH, RD. Found to have microscopic hematuria. CT Urogram from 2.23.24 reads Right renal staghorn calculus. No evidence of ureteral calculi or Hydronephrosis. Team is requesting a right sided  nephrostomy tube for percutaneous nephrolithotomy access.   Labs pending. All medications are within acceptable parameters. NKDA. Patient has been NPO since midnight.   Risks and benefits of right sided PCN placement was discussed with the patient including, but not limited to, infection, bleeding, significant bleeding causing loss or decrease in renal function or damage to adjacent structures.   All of the patient's questions were answered, patient is agreeable to proceed.  Consent signed and in chart.  Thank you for this interesting consult.  I greatly enjoyed meeting Brendan West and look forward to participating in their care.  A copy of this report was sent to the requesting provider on this date.  Electronically Signed: Alene Mires, NP 07/05/2022, 8:32 AM   I spent a total of  30 Minutes   in face to face in clinical consultation, greater than 50% of which was counseling/coordinating care for right sided nephrostomy tube placement

## 2022-07-05 ENCOUNTER — Encounter
Admission: RE | Disposition: A | Discharge status: Home / Self Care | Payer: Self-pay | Source: Home / Self Care | Attending: Urology

## 2022-07-05 ENCOUNTER — Ambulatory Visit
Admission: RE | Admit: 2022-07-05 | Discharge: 2022-07-05 | Disposition: A | Discharge status: Home / Self Care | Payer: Medicare HMO | Source: Ambulatory Visit | Attending: Urology | Admitting: Urology

## 2022-07-05 ENCOUNTER — Other Ambulatory Visit: Payer: Self-pay

## 2022-07-05 ENCOUNTER — Ambulatory Visit: Payer: Medicare HMO

## 2022-07-05 ENCOUNTER — Ambulatory Visit: Payer: Medicare HMO | Admitting: Anesthesiology

## 2022-07-05 ENCOUNTER — Observation Stay
Admission: RE | Admit: 2022-07-05 | Discharge: 2022-07-06 | Disposition: A | Discharge status: Home / Self Care | Payer: Medicare HMO | Attending: Urology | Admitting: Urology

## 2022-07-05 ENCOUNTER — Ambulatory Visit: Payer: Medicare HMO | Admitting: Urgent Care

## 2022-07-05 ENCOUNTER — Encounter: Payer: Self-pay | Admitting: Urology

## 2022-07-05 DIAGNOSIS — Z794 Long term (current) use of insulin: Secondary | ICD-10-CM | POA: Diagnosis not present

## 2022-07-05 DIAGNOSIS — N200 Calculus of kidney: Secondary | ICD-10-CM

## 2022-07-05 DIAGNOSIS — E119 Type 2 diabetes mellitus without complications: Secondary | ICD-10-CM | POA: Diagnosis not present

## 2022-07-05 DIAGNOSIS — K5730 Diverticulosis of large intestine without perforation or abscess without bleeding: Secondary | ICD-10-CM | POA: Diagnosis not present

## 2022-07-05 DIAGNOSIS — Z79899 Other long term (current) drug therapy: Secondary | ICD-10-CM | POA: Insufficient documentation

## 2022-07-05 DIAGNOSIS — Z7984 Long term (current) use of oral hypoglycemic drugs: Secondary | ICD-10-CM | POA: Diagnosis not present

## 2022-07-05 DIAGNOSIS — I10 Essential (primary) hypertension: Secondary | ICD-10-CM | POA: Diagnosis not present

## 2022-07-05 DIAGNOSIS — Z01812 Encounter for preprocedural laboratory examination: Secondary | ICD-10-CM

## 2022-07-05 HISTORY — PX: NEPHROLITHOTOMY: SHX5134

## 2022-07-05 HISTORY — PX: IR URETERAL STENT RIGHT NEW ACCESS W/O SEP NEPHROSTOMY CATH: IMG6076

## 2022-07-05 LAB — GLUCOSE, CAPILLARY
Glucose-Capillary: 151 mg/dL — ABNORMAL HIGH (ref 70–99)
Glucose-Capillary: 216 mg/dL — ABNORMAL HIGH (ref 70–99)
Glucose-Capillary: 262 mg/dL — ABNORMAL HIGH (ref 70–99)

## 2022-07-05 LAB — CBC
HCT: 41.4 % (ref 39.0–52.0)
Hemoglobin: 13.6 g/dL (ref 13.0–17.0)
MCH: 29 pg (ref 26.0–34.0)
MCHC: 32.9 g/dL (ref 30.0–36.0)
MCV: 88.3 fL (ref 80.0–100.0)
Platelets: 101 10*3/uL — ABNORMAL LOW (ref 150–400)
RBC: 4.69 MIL/uL (ref 4.22–5.81)
RDW: 14.8 % (ref 11.5–15.5)
WBC: 4.9 10*3/uL (ref 4.0–10.5)
nRBC: 0 % (ref 0.0–0.2)

## 2022-07-05 LAB — PROTIME-INR
INR: 1.1 (ref 0.8–1.2)
Prothrombin Time: 14.4 seconds (ref 11.4–15.2)

## 2022-07-05 LAB — BASIC METABOLIC PANEL
Anion gap: 9 (ref 5–15)
BUN: 17 mg/dL (ref 8–23)
CO2: 22 mmol/L (ref 22–32)
Calcium: 9 mg/dL (ref 8.9–10.3)
Chloride: 107 mmol/L (ref 98–111)
Creatinine, Ser: 1.08 mg/dL (ref 0.61–1.24)
GFR, Estimated: >60 mL/min (ref >60)
Glucose, Bld: 169 mg/dL — ABNORMAL HIGH (ref 70–99)
Potassium: 3.5 mmol/L (ref 3.5–5.1)
Sodium: 138 mmol/L (ref 135–145)

## 2022-07-05 LAB — HEMOGLOBIN A1C
Hgb A1c MFr Bld: 7.1 % — ABNORMAL HIGH (ref 4.8–5.6)
Mean Plasma Glucose: 157.07 mg/dL

## 2022-07-05 SURGERY — NEPHROLITHOTOMY PERCUTANEOUS
Anesthesia: General | Laterality: Right

## 2022-07-05 MED ORDER — ACETAMINOPHEN 10 MG/ML IV SOLN: 1000.0000 mg | Freq: Once | INTRAVENOUS | Status: DC | PRN

## 2022-07-05 MED ORDER — OXYBUTYNIN CHLORIDE 5 MG PO TABS: 5.0000 mg | ORAL_TABLET | Freq: Three times a day (TID) | ORAL | Status: DC | PRN

## 2022-07-05 MED ORDER — CEFAZOLIN SODIUM-DEXTROSE 1-4 GM/50ML-% IV SOLN
1.0000 g | Freq: Three times a day (TID) | INTRAVENOUS | Status: AC
  Administered 2022-07-05 – 2022-07-06 (×2): 1 g via INTRAVENOUS
  Filled 2022-07-05 (×2): qty 50

## 2022-07-05 MED ORDER — SUGAMMADEX SODIUM 200 MG/2ML IV SOLN
INTRAVENOUS | Status: DC | PRN
  Administered 2022-07-05: 200 mg via INTRAVENOUS

## 2022-07-05 MED ORDER — CEFAZOLIN SODIUM-DEXTROSE 2-4 GM/100ML-% IV SOLN
INTRAVENOUS | Status: AC
  Filled 2022-07-05: qty 100

## 2022-07-05 MED ORDER — LIDOCAINE HCL (CARDIAC) PF 100 MG/5ML IV SOSY
PREFILLED_SYRINGE | INTRAVENOUS | Status: DC | PRN
  Administered 2022-07-05: 100 mg via INTRAVENOUS

## 2022-07-05 MED ORDER — PROPOFOL 10 MG/ML IV BOLUS
INTRAVENOUS | Status: AC
  Filled 2022-07-05: qty 20

## 2022-07-05 MED ORDER — LIDOCAINE HCL 1 % IJ SOLN
10.0000 mL | Freq: Once | INTRAMUSCULAR | Status: AC
  Administered 2022-07-05: 10 mL

## 2022-07-05 MED ORDER — CHLORHEXIDINE GLUCONATE 0.12 % MT SOLN
OROMUCOSAL | Status: AC
  Filled 2022-07-05: qty 15

## 2022-07-05 MED ORDER — INSULIN ASPART 100 UNIT/ML IJ SOLN
0.0000 [IU] | Freq: Every day | INTRAMUSCULAR | Status: DC
  Administered 2022-07-05: 3 [IU] via SUBCUTANEOUS
  Filled 2022-07-05: qty 1

## 2022-07-05 MED ORDER — LIDOCAINE HCL 1 % IJ SOLN
INTRAMUSCULAR | Status: AC
  Filled 2022-07-05: qty 20

## 2022-07-05 MED ORDER — DEXAMETHASONE SODIUM PHOSPHATE 10 MG/ML IJ SOLN
INTRAMUSCULAR | Status: DC | PRN
  Administered 2022-07-05: 10 mg via INTRAVENOUS

## 2022-07-05 MED ORDER — IRBESARTAN 150 MG PO TABS
150.0000 mg | ORAL_TABLET | Freq: Every day | ORAL | Status: DC
  Administered 2022-07-06: 150 mg via ORAL
  Filled 2022-07-05: qty 1

## 2022-07-05 MED ORDER — MIDAZOLAM HCL 2 MG/2ML IJ SOLN
INTRAMUSCULAR | Status: AC
  Filled 2022-07-05: qty 2

## 2022-07-05 MED ORDER — DEXMEDETOMIDINE HCL IN NACL 80 MCG/20ML IV SOLN
INTRAVENOUS | Status: AC
  Filled 2022-07-05: qty 20

## 2022-07-05 MED ORDER — INSULIN ASPART 100 UNIT/ML IJ SOLN
4.0000 [IU] | Freq: Three times a day (TID) | INTRAMUSCULAR | Status: DC
  Administered 2022-07-05 – 2022-07-06 (×3): 4 [IU] via SUBCUTANEOUS
  Filled 2022-07-05 (×3): qty 1

## 2022-07-05 MED ORDER — FENTANYL CITRATE (PF) 100 MCG/2ML IJ SOLN
INTRAMUSCULAR | Status: AC
  Filled 2022-07-05: qty 2

## 2022-07-05 MED ORDER — PROMETHAZINE HCL 25 MG/ML IJ SOLN: 6.2500 mg | INTRAMUSCULAR | Status: DC | PRN

## 2022-07-05 MED ORDER — OXYCODONE HCL 5 MG/5ML PO SOLN: 5.0000 mg | Freq: Once | ORAL | Status: DC | PRN

## 2022-07-05 MED ORDER — DEXMEDETOMIDINE HCL IN NACL 80 MCG/20ML IV SOLN
INTRAVENOUS | Status: DC | PRN
  Administered 2022-07-05 (×2): 8 ug via INTRAVENOUS

## 2022-07-05 MED ORDER — ROCURONIUM BROMIDE 10 MG/ML (PF) SYRINGE
PREFILLED_SYRINGE | INTRAVENOUS | Status: AC
  Filled 2022-07-05: qty 10

## 2022-07-05 MED ORDER — DROPERIDOL 2.5 MG/ML IJ SOLN: 0.6250 mg | Freq: Once | INTRAMUSCULAR | Status: DC | PRN

## 2022-07-05 MED ORDER — ONDANSETRON HCL 4 MG/2ML IJ SOLN
INTRAMUSCULAR | Status: DC | PRN
  Administered 2022-07-05: 4 mg via INTRAVENOUS

## 2022-07-05 MED ORDER — MIDAZOLAM HCL 5 MG/5ML IJ SOLN
INTRAMUSCULAR | Status: AC | PRN
  Administered 2022-07-05: 1 mg via INTRAVENOUS
  Administered 2022-07-05 (×2): .5 mg via INTRAVENOUS

## 2022-07-05 MED ORDER — ONDANSETRON HCL 4 MG/2ML IJ SOLN
INTRAMUSCULAR | Status: AC
  Filled 2022-07-05: qty 2

## 2022-07-05 MED ORDER — GABAPENTIN 300 MG PO CAPS
300.0000 mg | ORAL_CAPSULE | Freq: Once | ORAL | Status: AC
  Administered 2022-07-05: 300 mg via ORAL

## 2022-07-05 MED ORDER — BUPIVACAINE HCL (PF) 0.5 % IJ SOLN
INTRAMUSCULAR | Status: AC
  Filled 2022-07-05: qty 30

## 2022-07-05 MED ORDER — FAMOTIDINE 20 MG PO TABS
ORAL_TABLET | ORAL | Status: AC
  Filled 2022-07-05: qty 1

## 2022-07-05 MED ORDER — INSULIN ASPART 100 UNIT/ML IJ SOLN
5.0000 [IU] | Freq: Once | INTRAMUSCULAR | Status: AC
  Administered 2022-07-05: 5 [IU] via SUBCUTANEOUS

## 2022-07-05 MED ORDER — DIPHENHYDRAMINE HCL 50 MG/ML IJ SOLN: 12.5000 mg | Freq: Four times a day (QID) | INTRAMUSCULAR | Status: DC | PRN

## 2022-07-05 MED ORDER — ZOLPIDEM TARTRATE 5 MG PO TABS: 5.0000 mg | ORAL_TABLET | Freq: Every evening | ORAL | Status: DC | PRN

## 2022-07-05 MED ORDER — PROPOFOL 10 MG/ML IV BOLUS
INTRAVENOUS | Status: DC | PRN
  Administered 2022-07-05: 200 mg via INTRAVENOUS

## 2022-07-05 MED ORDER — ROCURONIUM BROMIDE 100 MG/10ML IV SOLN
INTRAVENOUS | Status: DC | PRN
  Administered 2022-07-05: 10 mg via INTRAVENOUS
  Administered 2022-07-05: 40 mg via INTRAVENOUS
  Administered 2022-07-05: 20 mg via INTRAVENOUS

## 2022-07-05 MED ORDER — ACETAMINOPHEN 500 MG PO TABS
1000.0000 mg | ORAL_TABLET | Freq: Once | ORAL | Status: AC
  Administered 2022-07-05: 1000 mg via ORAL

## 2022-07-05 MED ORDER — IOHEXOL 180 MG/ML  SOLN
INTRAMUSCULAR | Status: DC | PRN
  Administered 2022-07-05: 60 mL

## 2022-07-05 MED ORDER — SODIUM CHLORIDE 0.9 % IR SOLN
Status: DC | PRN
  Administered 2022-07-05: 30000 mL
  Administered 2022-07-05: 6000 mL

## 2022-07-05 MED ORDER — BUPIVACAINE HCL 0.5 % IJ SOLN
INTRAMUSCULAR | Status: DC | PRN
  Administered 2022-07-05: 10 mL

## 2022-07-05 MED ORDER — PRAVASTATIN SODIUM 20 MG PO TABS
20.0000 mg | ORAL_TABLET | Freq: Every day | ORAL | Status: DC
  Administered 2022-07-06: 20 mg via ORAL
  Filled 2022-07-05: qty 1

## 2022-07-05 MED ORDER — PHENYLEPHRINE HCL (PRESSORS) 10 MG/ML IV SOLN
INTRAVENOUS | Status: DC | PRN
  Administered 2022-07-05: 80 ug via INTRAVENOUS

## 2022-07-05 MED ORDER — FENTANYL CITRATE (PF) 100 MCG/2ML IJ SOLN
INTRAMUSCULAR | Status: DC | PRN
  Administered 2022-07-05 (×2): 50 ug via INTRAVENOUS

## 2022-07-05 MED ORDER — ACETAMINOPHEN 325 MG PO TABS: 650.0000 mg | ORAL_TABLET | ORAL | Status: DC | PRN

## 2022-07-05 MED ORDER — SEVOFLURANE IN SOLN
RESPIRATORY_TRACT | Status: AC
  Filled 2022-07-05: qty 250

## 2022-07-05 MED ORDER — SODIUM CHLORIDE 0.9 % IV SOLN: INTRAVENOUS | Status: DC

## 2022-07-05 MED ORDER — OXYCODONE-ACETAMINOPHEN 5-325 MG PO TABS
1.0000 | ORAL_TABLET | ORAL | Status: DC | PRN
  Administered 2022-07-05 – 2022-07-06 (×4): 2 via ORAL
  Filled 2022-07-05 (×4): qty 2

## 2022-07-05 MED ORDER — ONDANSETRON HCL 4 MG/2ML IJ SOLN: 4.0000 mg | INTRAMUSCULAR | Status: DC | PRN

## 2022-07-05 MED ORDER — INSULIN ASPART 100 UNIT/ML IJ SOLN
INTRAMUSCULAR | Status: AC
  Filled 2022-07-05: qty 1

## 2022-07-05 MED ORDER — INSULIN ASPART 100 UNIT/ML IJ SOLN
0.0000 [IU] | Freq: Three times a day (TID) | INTRAMUSCULAR | Status: DC
  Administered 2022-07-05: 5 [IU] via SUBCUTANEOUS
  Administered 2022-07-06: 3 [IU] via SUBCUTANEOUS
  Administered 2022-07-06: 2 [IU] via SUBCUTANEOUS
  Filled 2022-07-05 (×3): qty 1

## 2022-07-05 MED ORDER — ACETAMINOPHEN 500 MG PO TABS
ORAL_TABLET | ORAL | Status: AC
  Filled 2022-07-05: qty 2

## 2022-07-05 MED ORDER — IOHEXOL 300 MG/ML  SOLN
20.0000 mL | Freq: Once | INTRAMUSCULAR | Status: AC | PRN
  Administered 2022-07-05: 20 mL

## 2022-07-05 MED ORDER — SODIUM CHLORIDE 0.9 % IV SOLN
INTRAVENOUS | Status: AC | PRN
  Administered 2022-07-05: 2 g via INTRAVENOUS

## 2022-07-05 MED ORDER — FENTANYL CITRATE (PF) 100 MCG/2ML IJ SOLN
INTRAMUSCULAR | Status: AC | PRN
  Administered 2022-07-05: 50 ug via INTRAVENOUS
  Administered 2022-07-05 (×2): 25 ug via INTRAVENOUS

## 2022-07-05 MED ORDER — FINASTERIDE 5 MG PO TABS
5.0000 mg | ORAL_TABLET | Freq: Every day | ORAL | Status: DC
  Administered 2022-07-05 – 2022-07-06 (×2): 5 mg via ORAL
  Filled 2022-07-05 (×2): qty 1

## 2022-07-05 MED ORDER — VALSARTAN-HYDROCHLOROTHIAZIDE 160-12.5 MG PO TABS: 1.0000 | ORAL_TABLET | Freq: Every day | ORAL | Status: DC

## 2022-07-05 MED ORDER — OXYCODONE HCL 5 MG PO TABS: 5.0000 mg | ORAL_TABLET | Freq: Once | ORAL | Status: DC | PRN

## 2022-07-05 MED ORDER — PHENYLEPHRINE 80 MCG/ML (10ML) SYRINGE FOR IV PUSH (FOR BLOOD PRESSURE SUPPORT)
PREFILLED_SYRINGE | INTRAVENOUS | Status: AC
  Filled 2022-07-05: qty 10

## 2022-07-05 MED ORDER — HYDROCHLOROTHIAZIDE 12.5 MG PO TABS
12.5000 mg | ORAL_TABLET | Freq: Every day | ORAL | Status: DC
  Administered 2022-07-06: 12.5 mg via ORAL
  Filled 2022-07-05: qty 1

## 2022-07-05 MED ORDER — DIPHENHYDRAMINE HCL 12.5 MG/5ML PO ELIX: 12.5000 mg | ORAL_SOLUTION | Freq: Four times a day (QID) | ORAL | Status: DC | PRN

## 2022-07-05 MED ORDER — GABAPENTIN 300 MG PO CAPS
ORAL_CAPSULE | ORAL | Status: AC
  Filled 2022-07-05: qty 1

## 2022-07-05 MED ORDER — STERILE WATER FOR IRRIGATION IR SOLN
Status: DC | PRN
  Administered 2022-07-05: 500 mL

## 2022-07-05 MED ORDER — LIDOCAINE HCL (PF) 2 % IJ SOLN
INTRAMUSCULAR | Status: AC
  Filled 2022-07-05: qty 5

## 2022-07-05 MED ORDER — FENTANYL CITRATE (PF) 100 MCG/2ML IJ SOLN: 25.0000 ug | INTRAMUSCULAR | Status: DC | PRN

## 2022-07-05 MED ORDER — MORPHINE SULFATE (PF) 2 MG/ML IV SOLN
2.0000 mg | INTRAVENOUS | Status: DC | PRN
  Administered 2022-07-05: 2 mg via INTRAVENOUS
  Filled 2022-07-05: qty 1

## 2022-07-05 SURGICAL SUPPLY — 64 items
ADAPTER IRRIG TUBE 2 SPIKE SOL (ADAPTER) ×2 IMPLANT
ADPR TBG 2 SPK PMP STRL ASCP (ADAPTER) ×2
APL PRP STRL LF DISP 70% ISPRP (MISCELLANEOUS) ×1
BAG DRN RND TRDRP ANRFLXCHMBR (UROLOGICAL SUPPLIES) ×1
BAG URINE DRAIN 2000ML AR STRL (UROLOGICAL SUPPLIES) ×1 IMPLANT
BALLN NEPHROSTOMY 10X15 (UROLOGICAL SUPPLIES) ×1 IMPLANT
BALLN ULTRAXX (BALLOONS) ×1
BALLOON ULTRAXX (BALLOONS) IMPLANT
BASKET ZERO TIP 1.9FR (BASKET) IMPLANT
BLADE SURG 15 STRL LF DISP TIS (BLADE) ×1 IMPLANT
BLADE SURG 15 STRL SS (BLADE) ×1
BSKT STON RTRVL ZERO TP 1.9FR (BASKET) ×1
CATH FOLEY 2W COUNCIL 5CC 18FR (CATHETERS) IMPLANT
CATH STENT KAYE NEPHR TAMP (CATHETERS) IMPLANT
CATH URET FLEX-TIP 2 LUMEN 10F (CATHETERS) IMPLANT
CATH URETL OPEN 5X70 (CATHETERS) ×1 IMPLANT
CATH/STENT KAYE NEPHR TAMP (CATHETERS)
CHLORAPREP W/TINT 26 (MISCELLANEOUS) ×1 IMPLANT
CNTNR URN SCR LID CUP LEK RST (MISCELLANEOUS) ×1 IMPLANT
CONT SPEC 4OZ STRL OR WHT (MISCELLANEOUS) ×1
COVER EZ STRL 42X30 (DRAPES) ×1 IMPLANT
DRAPE 3/4 80X56 (DRAPES) ×1 IMPLANT
DRAPE C-ARM XRAY 36X54 (DRAPES) ×1 IMPLANT
DRAPE SURG 17X11 SM STRL (DRAPES) ×4 IMPLANT
GAUZE 4X4 16PLY ~~LOC~~+RFID DBL (SPONGE) ×1 IMPLANT
GAUZE SPONGE 4X4 12PLY STRL (GAUZE/BANDAGES/DRESSINGS) ×1 IMPLANT
GLOVE BIO SURGEON STRL SZ 6.5 (GLOVE) ×2 IMPLANT
GLOVE BIOGEL PI IND STRL 6.5 (GLOVE) ×2 IMPLANT
GOWN STRL REUS W/ TWL LRG LVL3 (GOWN DISPOSABLE) ×2 IMPLANT
GOWN STRL REUS W/TWL LRG LVL3 (GOWN DISPOSABLE) ×2
GUIDEWIRE GREEN .038 145CM (MISCELLANEOUS) ×1 IMPLANT
GUIDEWIRE INTRO SET STRAIGHT (WIRE) ×1 IMPLANT
GUIDEWIRE STR DUAL SENSOR (WIRE) ×1 IMPLANT
GUIDEWIRE STR ZIPWIRE 035X150 (MISCELLANEOUS) IMPLANT
GUIDEWIRE SUPER STIFF (WIRE) ×1 IMPLANT
HOLDER FOLEY CATH W/STRAP (MISCELLANEOUS) ×1 IMPLANT
IV NS IRRIG 3000ML ARTHROMATIC (IV SOLUTION) ×4 IMPLANT
KIT PROBE TRILOGY 3.9X350 (MISCELLANEOUS) ×1 IMPLANT
MANIFOLD NEPTUNE II (INSTRUMENTS) ×2 IMPLANT
MAT ABSORB FLUID 56X50 GRAY (MISCELLANEOUS) ×2 IMPLANT
NDL FASCIA INCISION 18GA (NEEDLE) ×1 IMPLANT
PACK BASIN MINOR ARMC (MISCELLANEOUS) ×1 IMPLANT
PAD ABD DERMACEA PRESS 5X9 (GAUZE/BANDAGES/DRESSINGS) ×2 IMPLANT
SET DILATOR URETRAL 8.5FR (MISCELLANEOUS) IMPLANT
SET IRRIGATING DISP (SET/KITS/TRAYS/PACK) ×1 IMPLANT
SHEET NEURO XL SOL CTL (MISCELLANEOUS) ×1 IMPLANT
SPONGE DRAIN TRACH 4X4 STRL 2S (GAUZE/BANDAGES/DRESSINGS) ×1 IMPLANT
STENT URET 6FRX24 CONTOUR (STENTS) IMPLANT
STENT URET 6FRX26 CONTOUR (STENTS) IMPLANT
STRAP SAFETY 5IN WIDE (MISCELLANEOUS) ×2 IMPLANT
SURGILUBE 2OZ TUBE FLIPTOP (MISCELLANEOUS) ×1 IMPLANT
SUT SILK 0 SH 30 (SUTURE) ×1 IMPLANT
SYR 10ML LL (SYRINGE) ×1 IMPLANT
SYR 20ML LL LF (SYRINGE) ×1 IMPLANT
SYR 30ML LL (SYRINGE) ×1 IMPLANT
SYR TOOMEY IRRIG 70ML (MISCELLANEOUS) ×1
SYRINGE TOOMEY IRRIG 70ML (MISCELLANEOUS) ×1 IMPLANT
TAPE CLOTH 3X10 WHT NS LF (GAUZE/BANDAGES/DRESSINGS) ×1 IMPLANT
TAPE MICROFOAM 4IN (TAPE) ×1 IMPLANT
TRAP FLUID SMOKE EVACUATOR (MISCELLANEOUS) ×1 IMPLANT
TRAY FOLEY MTR SLVR 16FR STAT (SET/KITS/TRAYS/PACK) ×1 IMPLANT
TUBING CONNECTING 10 (TUBING) ×1 IMPLANT
VALVE UROSEAL ADJ ENDO (VALVE) IMPLANT
WATER STERILE IRR 1000ML POUR (IV SOLUTION) ×1 IMPLANT

## 2022-07-05 NOTE — Anesthesia Postprocedure Evaluation (Signed)
Anesthesia Post Note  Patient: [name]  Procedure(s) Performed: NEPHROLITHOTOMY PERCUTANEOUS (Right)  Patient location during evaluation: PACU Anesthesia Type: General Level of consciousness: awake and alert Pain management: pain level controlled Vital Signs Assessment: post-procedure vital signs reviewed and stable Respiratory status: spontaneous breathing, nonlabored ventilation, respiratory function stable and patient connected to nasal cannula oxygen Cardiovascular status: blood pressure returned to baseline and stable Postop Assessment: no apparent nausea or vomiting Anesthetic complications: no   No notable events documented.   Last Vitals:  Vitals:   07/05/22 1432 07/05/22 1444  BP: 122/79 116/81  Pulse: 79 80  Resp: 17 17  Temp:  36.4 C  SpO2: 94% 95%    Last Pain:  Vitals:   07/05/22 1444  TempSrc:   PainSc: 0-No pain                 Corinda Gubler

## 2022-07-05 NOTE — Progress Notes (Signed)
Patient clinically stable post PCNL placement per Dr Juliette Alcide, tolerated well. Vitals stable pre and post procedure. Received Versed 2 mg along with Fentanyl 100 mcg. Awake/alert post procedure/ per MD request/surgeon, transported to OR with report given to CRNA at bedside.

## 2022-07-05 NOTE — Op Note (Signed)
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  EBL: 100 cc  Specimens: Kidney stone  Drains: 16 French Foley catheter, 18 French council tip Foley catheter as nephrostomy tube, 5 [place] open-ended nephroureteral catheter  Indication: Brendan West is a 67 y.o. patient with full right staghorn calculus.  After reviewing the management options for treatment, he elected to proceed with the above surgical procedure(s). We have discussed the potential benefits and risks of the procedure, side effects of the proposed treatment, the likelihood of the patient achieving the goals of the procedure, and any potential problems that might occur during the procedure or recuperation. Informed consent has been obtained.  Description of procedure:  The patient was taken to the operating room and general anesthesia was induced still on the stretcher.  A Foley catheter was placed.  He was then repositioned in the prone position with care to pad all pressure points and secured to the table.  Next, his previous access obtained in interventional radiology was identified.  He was prepped and draped in the standard sterile fashion.  Scout imaging revealed a nephroureteral catheter all the way down to the level of the bladder with access through a posterior right lower pole calyx.  It then cannulated this wire using a Super Stiff wire down to the level of the bladder leaving the wire in place and removing the nephroureteral  catheter.  I then extended the incision approximate 1/2 cm transversely.  8 [place] safety wire introducer was advanced just near the level of the UPJ and a second sensor wire was advanced all the way down to the level of the kidney as a safety wire which was excluded and stopped in place for the entirety of the procedure.  A fascial incising needle was used to make a cruciate incision in the fascia running it over the Super Stiff wire.  I then used a nephro lax balloon advanced just the level of the stone to dilate the tract.  The sheath was advanced to the level of the stone which went easily.  The balloon was then deflated and the scope was brought into the sheath directly adjacent to the stone.  The LithoClast machine using both ultrasonic and pneumatic settings was then used to begin to fragment the stone.  The stone was quite hard.  I first addressed the stone in the lower pole, followed by the renal pelvis and ultimately was able to navigate into the upper pole moiety's where the stone was also addressed.  This took several hours.  I then exchanged the nephroscope after removing the majority of the stone burden for a flexible cystoscope which I was able to use a 1.9 French tipless nitinol basket to remove some smaller pieces.  I was also able to navigate up into the upper pole to ensure that each every calyx had been directly visualized and all stone burden have been removed.  And then use the cystoscope to navigate through the UPJ down to the proximal ureter where I extracted additional stone fragments which had migrated into the proximal ureter.  I was able to advance the scope all  the way to the mid ureter and no additional stones or stone fragments were identified.  At this point in time, I felt that the procedure was complete in the absence of any significant residual stone burden.  Under direct visualization, introduce a third sensor wire down the ureter.  I advanced then a 5 [place] open-ended  nephroureteral catheter down to the distal ureter over this wire and remove the wire.  I then used the second working wire, the [employer] to advance an 18 [place] council tip to the level of the UPJ.  The balloon was filled with 3 cc of diluted contrast confirming good positioning.  The sheath was then removed.  Final antegrade nephrostogram showed good filling of the entire collecting system with some extravasation along the access tract.  Contrast was seen traversing down the ureter consistent with his patency.  The nephroureteral catheter and nephrostomy tube were then secured using silk x 2.  I then remove the final safety wire fluoroscopically which went easily.  10 cc half percent Marcaine was instilled into the wound.  The wound was dressed using 4 x 4's, ABD pads and foam tape and the Foley bag was connected to the nephrostomy tube.  Patient was then cleaned and dried, repositioned in the supine position, reversed of anesthesia, and taken to the PACU in stable condition.  Plan: Will keep him overnight.  Will plan to cap his nephrostomy tube early in the morning, and then sequentially remove his tubes prior to discharge.  Vanna Scotland, M.D.

## 2022-07-05 NOTE — Transfer of Care (Signed)
Immediate Anesthesia Transfer of Care Note  Patient: Brendan West  Procedure(s) Performed: NEPHROLITHOTOMY PERCUTANEOUS (Right)  Patient Location: PACU  Anesthesia Type:General  Level of Consciousness: awake, alert , and oriented  Airway & Oxygen Therapy: Patient Spontanous Breathing and Patient connected to face mask oxygen  Post-op Assessment: Report given to RN and Post -op Vital signs reviewed and stable  Post vital signs: Reviewed and stable  Last Vitals:  Vitals Value Taken Time  BP 122/78 07/05/22 1411  Temp 35.9 1411  Pulse 84 07/05/22 1415  Resp 19 1411  SpO2 100 % 07/05/22 1415  Vitals shown include unvalidated device data.  Last Pain:  Vitals:   07/05/22 0805  TempSrc: Tympanic         Complications: No notable events documented.

## 2022-07-05 NOTE — H&P (Signed)
07/05/22  RR[facility] CTAB  Brendan West 05-30-1955 [id number]   Referring provider: Jaclyn Shaggy, MD [address]                   [place],  [place] [address]      Chief Complaint  Patient presents with   Nephrolithiasis      HPI: 67 year-old male, previously under the care of Dr. Sheppard Penton who initially presented to Dr. Lonna Cobb to establish care due to a personal history of BPH, erectile dysfunction, and a known history of microscopic hematuria.    He was classified as high risk, a CT urogram was performed, revealing a large right renal staghorn calculus without evidence of obstruction. He presents to discuss the management of this renal calculus.    He has a history of kidney stones, with the last episode occurring a couple of years ago, which was managed by passing the stone without the need for surgical intervention. No current pain or symptoms related to the kidney stone. No recent urinary tract or kidney infections.    He has a planned trip to [place] and has opted to schedule the procedure upon his return.   PMH:     Past Medical History:  Diagnosis Date   Colon polyp     Diabetes mellitus without complication (HCC)     History of kidney stones     Hypertension      2008   Prostate enlargement        Surgical History:      Past Surgical History:  Procedure Laterality Date   COLONOSCOPY   11/22/2012    Diverticulosis   COLONOSCOPY W/ BIOPSIES AND POLYPECTOMY   03/14/2007    8 mm tubular adenoma without atypica in the proximal ascending colon resected & retrieved. Diverticulosis sigmoid colon. Family history of adenometous polyps   COLONOSCOPY WITH PROPOFOL N/A 12/07/2017    Procedure: COLONOSCOPY WITH PROPOFOL;  Surgeon: [name], MD;  Location: [facility] ENDOSCOPY;  Service: Endoscopy;  Laterality: N/A;      Home Medications:  Allergies as of 03/31/2022   No Known Allergies         Medication List           Accurate as of March 31, 2022  3:09 PM. If  you have any questions, ask your nurse or doctor.              Accu-Chek Guide test strip Generic drug: glucose blood    finasteride 5 MG tablet Commonly known as: PROSCAR Take 5 mg by mouth daily.    Jardiance 10 MG Tabs tablet Generic drug: empagliflozin Take 10 mg by mouth daily.    metFORMIN 500 MG tablet Commonly known as: GLUCOPHAGE Take 500 mg by mouth 2 (two) times daily with a meal.    pravastatin 20 MG tablet Commonly known as: PRAVACHOL Take 20 mg by mouth daily.    sildenafil 20 MG tablet Commonly known as: REVATIO Take 20 mg by mouth 3 (three) times daily.    valsartan-hydrochlorothiazide 160-12.5 MG tablet Commonly known as: DIOVAN-HCT Take 1 tablet by mouth daily.             Family History:      Family History  Problem Relation Age of Onset   Colon polyps Brother          2008      Social History:  reports that he has never smoked. He has never used  smokeless tobacco. He reports current alcohol use. He reports that he does not use drugs.     Physical Exam: BP (!) 150/86   Pulse 86   Ht 5\' 6"  (1.676 m)   Wt 155 lb (70.3 kg)   BMI 25.02 kg/m   Constitutional:  Alert and oriented, No acute distress. HEENT: [place] AT, moist mucus membranes.  Trachea midline, no masses. Neurologic: Grossly intact, no focal deficits, moving all 4 extremities. Psychiatric: Normal mood and affect.   Pertinent Imaging:   Results for orders placed during the hospital encounter of 03/19/22   CT HEMATURIA WORKUP   Narrative CLINICAL DATA:  Microscopic hematuria.   EXAM: CT ABDOMEN AND PELVIS WITHOUT AND WITH CONTRAST   TECHNIQUE: Multidetector CT imaging of the abdomen and pelvis was performed following the standard protocol before and following the bolus administration of intravenous contrast.   RADIATION DOSE REDUCTION: This exam was performed according to the departmental dose-optimization program which includes automated exposure control, adjustment  of the mA and/or kV according to patient size and/or use of iterative reconstruction technique.   CONTRAST:  OMNIPAQUE IOHEXOL 300 MG/ML  SOLN   COMPARISON:  Noncontrast CT on 04/07/2013   FINDINGS: Lower Chest: No acute findings.   Hepatobiliary: Increased since previous study. Tiny sub-cm cyst noted in the posterior hepatic lobe. No hepatic masses identified. Gallbladder is unremarkable. No evidence of biliary ductal dilatation.   Pancreas:  No mass or inflammatory changes.   Spleen: Within normal limits in size and appearance.   Adrenals/Urinary Tract: No adrenal masses identified. Staghorn calculus seen throughout the right renal collecting system. No evidence of ureteral calculi or hydronephrosis. No suspicious renal masses identified. No masses seen involving the collecting systems, ureters, or bladder.   Stomach/Bowel: No evidence of obstruction, inflammatory process or abnormal fluid collections. Normal appendix visualized. Diverticulosis is seen mainly involving the descending and sigmoid colon, however there is no evidence of diverticulitis.   Vascular/Lymphatic: No pathologically enlarged lymph nodes. No acute vascular findings.   Reproductive:  No mass or other significant abnormality.   Other:  None.   Musculoskeletal:  No suspicious bone lesions identified.   IMPRESSION: Right renal staghorn calculus. No evidence of ureteral calculi or hydronephrosis.   No radiographic evidence of urinary tract neoplasm.   Colonic diverticulosis, without radiographic evidence of diverticulitis.     Electronically Signed By: Danae Orleans M.D. On: 03/19/2022 14:11   Personally reviewed and agree with radiologic interpretation.  Also printed and shared the images with the patient providing him with copies of a screenshot of his overall stone burden.   Assessment & Plan:      Large right renal staghorn calculus - We discussed various treatment options for  urolithiasis including observation with or without medical expulsive therapy, shockwave lithotripsy (SWL), ureteroscopy and laser lithotripsy with stent placement, and percutaneous nephrolithotomy.   We discussed that management is based on stone size, location, density, patient co-morbidities, and patient preference.    SWL has a lower stone free rate in a single procedure, but also a lower complication rate compared to ureteroscopy and avoids a stent and associated stent related symptoms. Possible complications include renal hematoma, steinstrasse, and need for additional treatment. We discussed the role of his increased skin to stone distance can lead to decreased efficacy with shockwave lithotripsy.   Ureteroscopy with laser lithotripsy and stent placement has a higher stone free rate than SWL in a single procedure, however increased complication rate including possible infection, ureteral  injury, bleeding, and stent related morbidity. Common stent related symptoms include dysuria, urgency/frequency, and flank pain.   Lastly, we discussed standard of care for the stone which would be PCNL.  We discussed the procedure itself including coordination with interventional radiology to provide access along with the details of the procedure itself.  We discussed that if there is any residual stone burden, he will likely need a second procedure, possibly less invasive.  We discussed the possible need for ureteral stent.  We discussed the need for overnight observation with nephrostomy tube and Foley, risk of bleeding with approximately on average 5% risk of need of blood transfusion, infection, damage surrounding structures amongst others.  He understands all of this.  He understands that this modality has the highest stone free rate compared to the others.   After an extensive discussion of the risks and benefits of the above treatment options, the patient would like to proceed with right PCNL upon his return  from [place].   - Pre-op labs and urine to be collected closer to the procedure date.   2. History of microscopic hematuria - Likely related to the large renal calculus. Plan to re-evaluate hematuria post-procedure.   Return for right PCNL when next available.     [facility] [address] [place], [place] [address] [phone]

## 2022-07-05 NOTE — Anesthesia Preprocedure Evaluation (Addendum)
Anesthesia Evaluation  Patient identified by MRN, date of birth, ID band Patient awake    Reviewed: Allergy & Precautions, H&P , NPO status , Patient's Chart, lab work & pertinent test results  Airway Mallampati: II  TM Distance: >3 FB Neck ROM: full    Dental  (+) Missing,    Pulmonary neg pulmonary ROS   Pulmonary exam normal        Cardiovascular Exercise Tolerance: Good hypertension, Pt. on medications Normal cardiovascular exam     Neuro/Psych negative neurological ROS  negative psych ROS   GI/Hepatic negative GI ROS,,,LFT mildly elevated   Endo/Other  diabetes, Well Controlled, Type 2    Renal/GU Renal diseaseRight renal staghorn calculus     Musculoskeletal   Abdominal Normal abdominal exam  (+)   Peds  Hematology  (+) Blood dyscrasia thrombocytopenia   Anesthesia Other Findings Past Medical History: No date: Colon polyp No date: Diabetes mellitus without complication (HCC) No date: History of kidney stones No date: Hypertension     Comment:  2008 No date: Prostate enlargement  Past Surgical History: 11/22/2012: COLONOSCOPY     Comment:  Diverticulosis 03/14/2007: COLONOSCOPY W/ BIOPSIES AND POLYPECTOMY     Comment:  8 mm tubular adenoma without atypica in the proximal               ascending colon resected & retrieved. Diverticulosis               sigmoid colon. Family history of adenometous polyps 12/07/2017: COLONOSCOPY WITH PROPOFOL; N/A     Comment:  Procedure: COLONOSCOPY WITH PROPOFOL;  Surgeon: [name], MD;  Location: [facility] ENDOSCOPY;  Service:               Endoscopy;  Laterality: N/A;  BMI    Body Mass Index: 25.34 kg/m      Reproductive/Obstetrics negative OB ROS                              Anesthesia Physical Anesthesia Plan  ASA: 2  Anesthesia Plan: General ETT   Post-op Pain Management: Tylenol PO (pre-op)* and Gabapentin  PO (pre-op)*   Induction: Intravenous  PONV Risk Score and Plan: 3 and Ondansetron, Dexamethasone and Midazolam  Airway Management Planned: Oral ETT  Additional Equipment:   Intra-op Plan:   Post-operative Plan: Extubation in OR  Informed Consent: I have reviewed the patients History and Physical, chart, labs and discussed the procedure including the risks, benefits and alternatives for the proposed anesthesia with the patient or authorized representative who has indicated his/her understanding and acceptance.     Dental Advisory Given  Plan Discussed with: CRNA and Surgeon  Anesthesia Plan Comments:          Anesthesia Quick Evaluation

## 2022-07-05 NOTE — Anesthesia Procedure Notes (Signed)
Procedure Name: Intubation Date/Time: 07/05/2022 11:23 AM  Performed by: Morene Crocker, CRNAPre-anesthesia Checklist: Patient identified, Patient being monitored, Timeout performed, Emergency Drugs available and Suction available Patient Re-evaluated:Patient Re-evaluated prior to induction Oxygen Delivery Method: Circle system utilized Preoxygenation: Pre-oxygenation with 100% oxygen Induction Type: IV induction Ventilation: Mask ventilation without difficulty Laryngoscope Size: Glidescope and 4 Grade View: Grade I Tube type: Oral Tube size: 7.5 mm Number of attempts: 1 Airway Equipment and Method: Stylet Placement Confirmation: ETT inserted through vocal cords under direct vision, positive ETCO2 and breath sounds checked- equal and bilateral Secured at: 22 cm Tube secured with: Tape Dental Injury: Teeth and Oropharynx as per pre-operative assessment

## 2022-07-06 ENCOUNTER — Encounter: Payer: Self-pay | Admitting: Urology

## 2022-07-06 ENCOUNTER — Other Ambulatory Visit: Payer: Self-pay | Admitting: Physician Assistant

## 2022-07-06 ENCOUNTER — Other Ambulatory Visit: Payer: Self-pay

## 2022-07-06 DIAGNOSIS — N200 Calculus of kidney: Principal | ICD-10-CM

## 2022-07-06 LAB — CBC
HCT: 36 % — ABNORMAL LOW (ref 39.0–52.0)
Hemoglobin: 11.4 g/dL — ABNORMAL LOW (ref 13.0–17.0)
MCH: 29.2 pg (ref 26.0–34.0)
MCHC: 31.7 g/dL (ref 30.0–36.0)
MCV: 92.3 fL (ref 80.0–100.0)
Platelets: 100 10*3/uL — ABNORMAL LOW (ref 150–400)
RBC: 3.9 MIL/uL — ABNORMAL LOW (ref 4.22–5.81)
RDW: 14.8 % (ref 11.5–15.5)
WBC: 8.9 10*3/uL (ref 4.0–10.5)
nRBC: 0 % (ref 0.0–0.2)

## 2022-07-06 LAB — BASIC METABOLIC PANEL
Anion gap: 7 (ref 5–15)
BUN: 18 mg/dL (ref 8–23)
CO2: 21 mmol/L — ABNORMAL LOW (ref 22–32)
Calcium: 7.6 mg/dL — ABNORMAL LOW (ref 8.9–10.3)
Chloride: 110 mmol/L (ref 98–111)
Creatinine, Ser: 1.11 mg/dL (ref 0.61–1.24)
GFR, Estimated: >60 mL/min (ref >60)
Glucose, Bld: 164 mg/dL — ABNORMAL HIGH (ref 70–99)
Potassium: 3.8 mmol/L (ref 3.5–5.1)
Sodium: 138 mmol/L (ref 135–145)

## 2022-07-06 LAB — GLUCOSE, CAPILLARY
Glucose-Capillary: 124 mg/dL — ABNORMAL HIGH (ref 70–99)
Glucose-Capillary: 167 mg/dL — ABNORMAL HIGH (ref 70–99)

## 2022-07-06 MED ORDER — CHLORHEXIDINE GLUCONATE CLOTH 2 % EX PADS
6.0000 | MEDICATED_PAD | Freq: Every day | CUTANEOUS | Status: DC
  Administered 2022-07-06: 6 via TOPICAL

## 2022-07-06 MED ORDER — OXYCODONE-ACETAMINOPHEN 5-325 MG PO TABS: 1.0000 | ORAL_TABLET | Freq: Four times a day (QID) | ORAL | 0 refills | Status: DC | PRN

## 2022-07-06 NOTE — Discharge Summary (Signed)
Date of admission: 07/05/2022  Date of discharge: 07/06/2022  Admission diagnosis: Right staghorn calculus  Discharge diagnosis: Same as above  Secondary diagnoses:  Patient Active Problem List   Diagnosis Date Noted   Right kidney stone 07/05/2022   Personal history of colonic polyps 09/14/2012   History and Physical: For full details, please see admission history and physical. Briefly, Brendan West is a 67 y.o. year old patient admitted on 07/05/2022 for scheduled right PCNL with Dr. Apolinar Junes.   At midday, I returned to the bedside to reassess him after having removed his Foley and plugged his nephrostomy tube in the morning.  He reports his pain is well-controlled and he has been able to spontaneously void without difficulty.  He has no acute concerns.  Michiel Cowboy and I removed his right nephrostomy tube at the bedside.  Using sterile technique, I snipped the securing stitches and removed them in their entirety.  We deflated the catheter balloon with 2 cc of water and removed the nephrostomy tube in its entirety.  Patient tolerated well, no evidence of hemorrhage.  There was scant leaking of pink-tinged urine with nephrostomy tube removal.  Right flank dressing was replaced and we counseled him to anticipate continued urinary leakage from his nephrostomy site for about the next week until it spontaneously heals.  Physical Exam: Constitutional:  Alert and oriented, no acute distress, nontoxic appearing HEENT: [place], AT Cardiovascular: No clubbing, cyanosis, or edema Respiratory: Normal respiratory effort, no increased work of breathing Skin: No rashes, bruises or suspicious lesions Neurologic: Grossly intact, no focal deficits, moving all 4 extremities Psychiatric: Normal mood and affect   Hospital Course: Patient tolerated the procedure well.  He was then transferred to the floor after an uneventful PACU stay.  His hospital course was uncomplicated.  On POD#1 he had met discharge  criteria: was eating a regular diet, was up and ambulating independently,  pain was well controlled, tolerated a clamping trial, Foley and NT were removed, and was ready for discharge.  Laboratory values:  Recent Labs    07/05/22 0831 07/06/22 0521  WBC 4.9 8.9  HGB 13.6 11.4*  HCT 41.4 36.0*   Recent Labs    07/05/22 0832 07/06/22 0521  NA 138 138  K 3.5 3.8  CL 107 110  CO2 22 21*  GLUCOSE 169* 164*  BUN 17 18  CREATININE 1.08 1.11  CALCIUM 9.0 7.6*   Recent Labs    07/05/22 0831  INR 1.1   Results for orders placed or performed during the hospital encounter of 06/29/22  Urine Culture     Status: None   Collection Time: 06/29/22 10:30 AM   Specimen: Urine, Clean Catch  Result Value Ref Range Status   Specimen Description   Final    URINE, CLEAN CATCH Performed at [facility], [address], [place], [place] [address]    Special Requests   Final    NONE Performed at [facility], [address] [place], [place] [address]    Culture   Final    NO GROWTH Performed at [facility], [address], [place], [place] [address]    Report Status 06/30/2022 FINAL  Final   Disposition: Home  Discharge instruction: The patient was instructed to be ambulatory but told to refrain from heavy lifting, strenuous activity, or driving.  Discharge medications:  Allergies as of 07/06/2022   No Known Allergies      Medication List     TAKE  these medications    Accu-Chek Guide test strip Generic drug: glucose blood   finasteride 5 MG tablet Commonly known as: PROSCAR Take 1 tablet (5 mg total) by mouth daily.   glipiZIDE 2.5 MG 24 hr tablet Commonly known as: GLUCOTROL XL Take 2.5 mg by mouth daily with breakfast.   Jardiance 25 MG Tabs tablet Generic drug: empagliflozin Take 25 mg by mouth daily.   metFORMIN 500 MG tablet Commonly known as: GLUCOPHAGE Take 500 mg by mouth 2 (two) times daily with a meal.    oxyCODONE-acetaminophen 5-325 MG tablet Commonly known as: PERCOCET/ROXICET Take 1-2 tablets by mouth every 6 (six) hours as needed for moderate pain.   pravastatin 20 MG tablet Commonly known as: PRAVACHOL Take 20 mg by mouth daily.   sildenafil 20 MG tablet Commonly known as: REVATIO Take 20 mg by mouth as needed.   valsartan-hydrochlorothiazide 160-12.5 MG tablet Commonly known as: DIOVAN-HCT Take 1 tablet by mouth daily.       Followup:   Follow-up Information     Vanna Scotland, MD Follow up in 4 week(s).   Specialty: Urology Why: For postop follow-up with renal ultrasound prior Contact information: [address] [place] [address] [phone]

## 2022-07-06 NOTE — Progress Notes (Signed)
Urology Inpatient Progress Note  Subjective: No acute events overnight. He is afebrile, VSS. Hemoglobin down, 11.4.  Creatinine stable, 1.11. On arrival, Foley catheter is in place draining red urine.  Nephrostomy tube is clamped. He reports some right-sided soreness but overall feels well.  Anti-infectives: Anti-infectives (From admission, onward)    Start     Dose/Rate Route Frequency Ordered Stop   07/05/22 1900  ceFAZolin (ANCEF) IVPB 1 g/50 mL premix        1 g 100 mL/hr over 30 Minutes Intravenous Every 8 hours 07/05/22 1503 07/06/22 0315   07/04/22 2145  cefTRIAXone (ROCEPHIN) 2 g in sodium chloride 0.9 % 100 mL IVPB  Status:  Discontinued        2 g 200 mL/hr over 30 Minutes Intravenous On call 07/04/22 2133 07/05/22 1502   07/04/22 2134  ceFAZolin (ANCEF) IVPB 2g/100 mL premix        2 g 200 mL/hr over 30 Minutes Intravenous 30 min pre-op 07/04/22 2134 07/05/22 1145       Current Facility-Administered Medications  Medication Dose Route Frequency Provider Last Rate Last Admin   0.9 %  sodium chloride infusion   Intravenous Continuous Vanna Scotland, MD 125 mL/hr at 07/06/22 1610 Infusion Verify at 07/06/22 9604   acetaminophen (TYLENOL) tablet 650 mg  650 mg Oral Q4H PRN Vanna Scotland, MD       Chlorhexidine Gluconate Cloth 2 % PADS 6 each  6 each Topical Daily Vanna Scotland, MD   6 each at 07/06/22 0913   diphenhydrAMINE (BENADRYL) injection 12.5 mg  12.5 mg Intravenous Q6H PRN Vanna Scotland, MD       Or   diphenhydrAMINE (BENADRYL) 12.5 MG/5ML elixir 12.5 mg  12.5 mg Oral Q6H PRN Vanna Scotland, MD       finasteride (PROSCAR) tablet 5 mg  5 mg Oral Daily Vanna Scotland, MD   5 mg at 07/06/22 0913   irbesartan (AVAPRO) tablet 150 mg  150 mg Oral Daily Mila Merry A, RPH   150 mg at 07/06/22 0913   And   hydrochlorothiazide (HYDRODIURIL) tablet 12.5 mg  12.5 mg Oral Daily Mila Merry A, RPH   12.5 mg at 07/06/22 0913   insulin aspart (novoLOG) injection 0-15  Units  0-15 Units Subcutaneous TID WC Vanna Scotland, MD   2 Units at 07/06/22 0912   insulin aspart (novoLOG) injection 0-5 Units  0-5 Units Subcutaneous QHS Vanna Scotland, MD   3 Units at 07/05/22 2238   insulin aspart (novoLOG) injection 4 Units  4 Units Subcutaneous TID WC Vanna Scotland, MD   4 Units at 07/06/22 0912   morphine (PF) 2 MG/ML injection 2-4 mg  2-4 mg Intravenous Q2H PRN Vanna Scotland, MD   2 mg at 07/05/22 1517   ondansetron (ZOFRAN) injection 4 mg  4 mg Intravenous Q4H PRN Vanna Scotland, MD       oxybutynin (DITROPAN) tablet 5 mg  5 mg Oral Q8H PRN Vanna Scotland, MD       oxyCODONE-acetaminophen (PERCOCET/ROXICET) 5-325 MG per tablet 1-2 tablet  1-2 tablet Oral Q4H PRN Vanna Scotland, MD   2 tablet at 07/06/22 0744   pravastatin (PRAVACHOL) tablet 20 mg  20 mg Oral Daily Vanna Scotland, MD   20 mg at 07/06/22 0913   zolpidem (AMBIEN) tablet 5 mg  5 mg Oral QHS PRN Vanna Scotland, MD         Objective: Vital signs in last 24 hours: Temp:  [97 F (36.1 C)-98.2  F (36.8 C)] 98.1 F (36.7 C) (06/11 0453) Pulse Rate:  [63-84] 65 (06/11 0453) Resp:  [12-20] 20 (06/11 0829) BP: (112-141)/(74-88) 112/74 (06/11 0453) SpO2:  [94 %-100 %] 99 % (06/11 0453)  Intake/Output from previous day: 06/10 0701 - 06/11 0700 In: 2488.3 [P.O.:120; I.V.:2194.6; IV Piggyback:173.6] Out: 985 [Urine:535; Drains:425; Blood:25] Intake/Output this shift: Total I/O In: 604.2 [I.V.:604.2] Out: 25 [Drains:25]  Physical Exam Vitals and nursing note reviewed.  Constitutional:      General: He is not in acute distress. HENT:     Head: Normocephalic and atraumatic.  Pulmonary:     Effort: Pulmonary effort is normal. No respiratory distress.  Skin:    General: Skin is warm and dry.  Neurological:     Mental Status: He is alert and oriented to person, place, and time.  Psychiatric:        Mood and Affect: Mood normal.        Behavior: Behavior normal.    Lab Results:   Recent Labs    07/05/22 0831 07/06/22 0521  WBC 4.9 8.9  HGB 13.6 11.4*  HCT 41.4 36.0*  PLT 101* 100*   BMET Recent Labs    07/05/22 0832 07/06/22 0521  NA 138 138  K 3.5 3.8  CL 107 110  CO2 22 21*  GLUCOSE 169* 164*  BUN 17 18  CREATININE 1.08 1.11  CALCIUM 9.0 7.6*   PT/INR Recent Labs    07/05/22 0831  LABPROT 14.4  INR 1.1   Studies/Results: IR URETERAL STENT RIGHT NEW ACCESS W/O SEP NEPHROSTOMY CATH  Result Date: 07/05/2022 INDICATION: Right staghorn calculus, PCNL access EXAM: Placement of right percutaneous nephrostomy access using ultrasound and fluoroscopic guidance COMPARISON:  None Available. MEDICATIONS: Documented in the EMR ANESTHESIA/SEDATION: Moderate (conscious) sedation was employed during this procedure. A total of Versed 2 mg and Fentanyl 100 mcg was administered intravenously. Moderate Sedation Time: 35 minutes. The patient's level of consciousness and vital signs were monitored continuously by radiology nursing throughout the procedure under my direct supervision. CONTRAST:  20 mL Omnipaque 300-administered into the collecting system(s) FLUOROSCOPY TIME:  Fluoroscopy Time: 12.6 minutes (250 mGy) COMPLICATIONS: None immediate. PROCEDURE: Informed written consent was obtained from the patient after a thorough discussion of the procedural risks, benefits and alternatives. All questions were addressed. Maximal Sterile Barrier Technique was utilized including caps, mask, sterile gowns, sterile gloves, sterile drape, hand hygiene and skin antiseptic. A timeout was performed prior to the initiation of the procedure. The patient was placed prone on the exam table. The right flank was prepped and draped in the standard sterile fashion. Ultrasound was used to evaluate the right kidney, which demonstrated echogenic staghorn calculus. Skin entry site was marked, and local analgesia was obtained with 1% lidocaine. Using ultrasound guidance, an appropriate lower pole  posterior calyx was punctured using a 21-gauge Chiba needle. Wire appear to enter into the collecting system, although injection of contrast material through the transitional dilator suggested a peri collecting system access. Needle was then withdrawn, in the decision was made to proceed with fluoroscopic guided placement of the nephrostomy access using the staghorn calculus as a target. Using an inferolateral approach, a lower pole stone was directly targeted using fluoroscopic guidance and a 21 gauge Chiba needle. Entry into the collecting system was confirmed with return of urine, and gentle injection of contrast material under fluoroscopy opacifying the proximal collecting system. An 018 wire was advanced through the needle into the renal pelvis and proximal ureter, followed  by placement of a transition dilator. An antegrade nephrostogram was then performed, which demonstrated appropriate location in the collecting system. A large staghorn calculus is present. Over an 035 Bentson wire, a 5 French angled catheter was advanced into the urinary bladder. Location was confirmed with injection of contrast material opacifying the urinary bladder. The catheter was then secured to the skin using silk suture and a dressing. An end cap was placed. The patient tolerated the procedure well without immediate complication. IMPRESSION: Successful right percutaneous nephrostomy access of the right collecting system using ultrasound and fluoroscopic guidance for PCNL. Electronically Signed   By: Olive Bass M.D.   On: 07/05/2022 14:29   DG OR UROLOGY CYSTO IMAGE ([facility] ONLY)  Result Date: 07/05/2022 There is no interpretation for this exam.  This order is for images obtained during a surgical procedure.  Please See "Surgeries" Tab for more information regarding the procedure.    Catheter Removal  Patient is present today for a catheter removal.  10ml of water was drained from the balloon. A 16FR foley cath was removed  from the bladder, no complications were noted. Patient tolerated well.  Performed by: Carman Ching, PA-C    Assessment & Plan: 67 year old male with PMH BPH, ED, microscopic hematuria, and right renal staghorn stone now POD 1 from right PCNL with Dr. Apolinar Junes.  He is doing well this morning with no acute concerns.  I removed his urethral catheter at the bedside this morning as above and plugged his nephrostomy tube.  Will return at midday.  If he is tolerating his clamping trial well, will remove nephrostomy tube and move forward with plans for discharge this afternoon.  Carman Ching, PA-C 07/06/2022

## 2022-07-06 NOTE — Discharge Instructions (Signed)
We expect you will leak urine from your right back for about 1 week until the site of your nephrostomy tube heals shut. Please change your dressing as needed, at least once daily.

## 2022-07-06 NOTE — Plan of Care (Signed)
Patient is adequate for discharge per M.D. resolving plan of care.  Brendan West

## 2022-07-06 NOTE — TOC CM/SW Note (Signed)
Transition of Care [facility]) - Inpatient Brief Assessment   Patient Details  Name: Brendan West MRN: [id number] Date of Birth: January 26, 1956  Transition of Care [facility]) CM/SW Contact:    Kreg Shropshire, RN Phone Number: 07/06/2022, 2:35 PM   Clinical Narrative:    Transition of Care Asessment: Insurance and Status: Insurance coverage has been reviewed Patient has primary care physician: Yes   Prior level of function:: Indepent Prior/Current Home Services: No current home services Social Determinants of Health Reivew: SDOH reviewed no interventions necessary Readmission risk has been reviewed: No Transition of care needs: no transition of care needs at this time

## 2022-07-12 LAB — CALCULI, WITH PHOTOGRAPH (CLINICAL LAB)
Calcium Oxalate Monohydrate: 10 %
Uric Acid Calculi: 90 %
Weight Calculi: 11400 mg

## 2022-08-03 ENCOUNTER — Ambulatory Visit
Admission: RE | Admit: 2022-08-03 | Discharge: 2022-08-03 | Disposition: A | Discharge status: Home / Self Care | Payer: Medicare HMO | Source: Ambulatory Visit | Attending: Physician Assistant | Admitting: Physician Assistant

## 2022-08-03 DIAGNOSIS — N200 Calculus of kidney: Secondary | ICD-10-CM | POA: Insufficient documentation

## 2022-08-10 ENCOUNTER — Ambulatory Visit (INDEPENDENT_AMBULATORY_CARE_PROVIDER_SITE_OTHER): Payer: Medicare HMO | Admitting: Urology

## 2022-08-10 VITALS — BP 143/80 | HR 84 | Ht 66.0 in | Wt 157.0 lb

## 2022-08-10 DIAGNOSIS — N529 Male erectile dysfunction, unspecified: Secondary | ICD-10-CM

## 2022-08-10 DIAGNOSIS — Z09 Encounter for follow-up examination after completed treatment for conditions other than malignant neoplasm: Secondary | ICD-10-CM

## 2022-08-10 DIAGNOSIS — N528 Other male erectile dysfunction: Secondary | ICD-10-CM

## 2022-08-10 DIAGNOSIS — Z87442 Personal history of urinary calculi: Secondary | ICD-10-CM

## 2022-08-10 DIAGNOSIS — N200 Calculus of kidney: Secondary | ICD-10-CM

## 2022-08-10 MED ORDER — SILDENAFIL CITRATE 20 MG PO TABS
ORAL_TABLET | ORAL | 11 refills | Status: AC
Start: 2022-08-10 — End: ?

## 2022-08-10 NOTE — Patient Instructions (Signed)

## 2022-08-10 NOTE — Progress Notes (Signed)
I,[name],acting as a scribe for Brendan Scotland, MD.,have documented all relevant documentation on the behalf of Brendan Scotland, MD,as directed by  Brendan Scotland, MD while in the presence of Brendan Scotland, MD.  08/10/2022 5:58 PM   Brendan West 06-Sep-1955 [id number]  Referring provider: Jaclyn Shaggy, MD [address]   [place],  [place] [address]  Chief Complaint  Patient presents with   Post-op Follow-up    HPI: 67 year-old male presents today for a post operative follow-up with renal ultrasound.  He underwent right nephrolithotomy on 07/05/2022. The procedure was uncomplicated and all the tubes were removed. Post op renal ultrasound shows no evidence of residual stone burden or hydronephrosis. Stone analysis indicates 90% uric acid and 10% calcium oxalate monohydrate.  He brought in some of the stones he passed a week ago Sunday. He has had stones in the past.  He denies a personal history of gout as far as he knows.    PMH: Past Medical History:  Diagnosis Date   Colon polyp    Diabetes mellitus without complication (HCC)    History of kidney stones    Hypertension    2008   Prostate enlargement     Surgical History: Past Surgical History:  Procedure Laterality Date   COLONOSCOPY  11/22/2012   Diverticulosis   COLONOSCOPY W/ BIOPSIES AND POLYPECTOMY  03/14/2007   8 mm tubular adenoma without atypica in the proximal ascending colon resected & retrieved. Diverticulosis sigmoid colon. Family history of adenometous polyps   COLONOSCOPY WITH PROPOFOL N/A 12/07/2017   Procedure: COLONOSCOPY WITH PROPOFOL;  Surgeon: [name], MD;  Location: [facility] ENDOSCOPY;  Service: Endoscopy;  Laterality: N/A;   IR URETERAL STENT RIGHT NEW ACCESS W/O SEP NEPHROSTOMY CATH  07/05/2022   NEPHROLITHOTOMY Right 07/05/2022   Procedure: NEPHROLITHOTOMY PERCUTANEOUS;  Surgeon: Brendan Scotland, MD;  Location: [facility] ORS;  Service: Urology;  Laterality: Right;    Home  Medications:  Allergies as of 08/10/2022   No Known Allergies      Medication List        Accurate as of August 10, 2022  5:58 PM. If you have any questions, ask your nurse or doctor.          Accu-Chek Guide test strip Generic drug: glucose blood   finasteride 5 MG tablet Commonly known as: PROSCAR Take 1 tablet (5 mg total) by mouth daily.   glipiZIDE 2.5 MG 24 hr tablet Commonly known as: GLUCOTROL XL Take 2.5 mg by mouth daily with breakfast.   Jardiance 25 MG Tabs tablet Generic drug: empagliflozin Take 25 mg by mouth daily.   metFORMIN 500 MG tablet Commonly known as: GLUCOPHAGE Take 500 mg by mouth 2 (two) times daily with a meal.   oxyCODONE-acetaminophen 5-325 MG tablet Commonly known as: PERCOCET/ROXICET Take 1-2 tablets by mouth every 6 (six) hours as needed for moderate pain.   pravastatin 20 MG tablet Commonly known as: PRAVACHOL Take 20 mg by mouth daily.   sildenafil 20 MG tablet Commonly known as: REVATIO Take 1-5 tablets as needed , 1 hour prior intercourse What changed:  how much to take how to take this when to take this reasons to take this additional instructions   valsartan-hydrochlorothiazide 160-12.5 MG tablet Commonly known as: DIOVAN-HCT Take 1 tablet by mouth daily.        Family History: Family History  Problem Relation Age of Onset   Colon polyps Brother  2008    Social History:  reports that he has never smoked. He has never used smokeless tobacco. He reports current alcohol use. He reports current drug use. Drug: Marijuana.   Physical Exam: BP (!) 143/80   Pulse 84   Ht 5\' 6"  (1.676 m)   Wt 157 lb (71.2 kg)   BMI 25.34 kg/m   Constitutional:  Alert and oriented, No acute distress. HEENT: [place] AT, moist mucus membranes.  Trachea midline, no masses. Skin: Surgical incision healing well. Neurologic: Grossly intact, no focal deficits, moving all 4 extremities. Psychiatric: Normal mood and  affect.  Pertinent Imaging: Ultrasound renal complete  Narrative CLINICAL DATA:  Kidney stones  EXAM: RENAL / URINARY TRACT ULTRASOUND COMPLETE  COMPARISON:  CT abdomen pelvis 03/19/2022  FINDINGS: Right Kidney:  Renal measurements: 11.8 x 7.2 x 5.3 cm = volume: 234.3 mL. Normal renal cortical thickness and echogenicity. No hydronephrosis. Renal cysts measuring 1.6 cm and 1.4 cm. No imaging follow-up needed.  Left Kidney:  Renal measurements: 10.7 x 5.5 x 4 x 5 cm = volume: 122.5 mL. Echogenicity within normal limits. No mass or hydronephrosis visualized.  Bladder:  Appears normal for degree of bladder distention.  Other:  None.  IMPRESSION: No hydronephrosis.      Electronically Signed By: Annia Belt M.D. On: 08/06/2022 20:13 Personally reviewed today and agree with radiologic interpretation.   Assessment & Plan:    History of kidney stones/ uric acid nephrolithiasis  - S/p nephrolithotomy. Currently stone free.  - Encouraged a low purine diet. Gave reading material regarding what foods to avoid.   - Plan for 24 hr urine, Litholink. Explained the process of how to do it.   - Emphasized increased water intake.   - Plan to return in 1 year for another renal ultrasound.  Return in about 3 months (around 11/10/2022) for with a PA to review Litholink results.  I have reviewed the above documentation for accuracy and completeness, and I agree with the above.   Brendan Scotland, MD    [facility] [address], [address] [place], [place] [address] [phone]

## 2022-08-25 ENCOUNTER — Other Ambulatory Visit: Payer: Medicare HMO

## 2022-08-25 DIAGNOSIS — N200 Calculus of kidney: Secondary | ICD-10-CM

## 2022-11-10 ENCOUNTER — Ambulatory Visit: Payer: Medicare HMO | Admitting: Physician Assistant

## 2022-11-10 VITALS — BP 153/82 | HR 82

## 2022-11-10 DIAGNOSIS — R82991 Hypocitraturia: Secondary | ICD-10-CM

## 2022-11-10 DIAGNOSIS — R82992 Hyperoxaluria: Secondary | ICD-10-CM | POA: Diagnosis not present

## 2022-11-10 DIAGNOSIS — R82998 Other abnormal findings in urine: Secondary | ICD-10-CM

## 2022-11-10 MED ORDER — POTASSIUM CITRATE ER 15 MEQ (1620 MG) PO TBCR
1.0000 | EXTENDED_RELEASE_TABLET | Freq: Three times a day (TID) | ORAL | 2 refills | Status: DC
Start: 2022-11-10 — End: 2022-12-20

## 2022-11-10 NOTE — Progress Notes (Signed)
11/10/2022 4:23 PM   [name] 08-18-1955 [id number]  CC: Chief Complaint  Patient presents with   Follow-up   HPI: Brendan West is a 67 y.o. male with PMH nephrolithiasis with most recent stone analysis showing 90% uric acid, 10% calcium oxalate monohydrate who presents today for Litholink results.   Today he reports no acute concerns.  He tends to eat 3 meals daily.  He is not familiar with oxalate.  Litholink report notable for low urine pH, 5.09, hypocitraturia, 285, and hyperoxaluria, 46.  PMH: Past Medical History:  Diagnosis Date   Colon polyp    Diabetes mellitus without complication (HCC)    History of kidney stones    Hypertension    2008   Prostate enlargement     Surgical History: Past Surgical History:  Procedure Laterality Date   COLONOSCOPY  11/22/2012   Diverticulosis   COLONOSCOPY W/ BIOPSIES AND POLYPECTOMY  03/14/2007   8 mm tubular adenoma without atypica in the proximal ascending colon resected & retrieved. Diverticulosis sigmoid colon. Family history of adenometous polyps   COLONOSCOPY WITH PROPOFOL N/A 12/07/2017   Procedure: COLONOSCOPY WITH PROPOFOL;  Surgeon: [name], MD;  Location: [facility] ENDOSCOPY;  Service: Endoscopy;  Laterality: N/A;   IR URETERAL STENT RIGHT NEW ACCESS W/O SEP NEPHROSTOMY CATH  07/05/2022   NEPHROLITHOTOMY Right 07/05/2022   Procedure: NEPHROLITHOTOMY PERCUTANEOUS;  Surgeon: Vanna Scotland, MD;  Location: [facility] ORS;  Service: Urology;  Laterality: Right;    Home Medications:  Allergies as of 11/10/2022   No Known Allergies      Medication List        Accurate as of November 10, 2022  4:23 PM. If you have any questions, ask your nurse or doctor.          Accu-Chek Guide test strip Generic drug: glucose blood   finasteride 5 MG tablet Commonly known as: PROSCAR Take 1 tablet (5 mg total) by mouth daily.   glipiZIDE 2.5 MG 24 hr tablet Commonly known as: GLUCOTROL XL Take 2.5 mg by mouth  daily with breakfast.   Jardiance 25 MG Tabs tablet Generic drug: empagliflozin Take 25 mg by mouth daily.   metFORMIN 500 MG tablet Commonly known as: GLUCOPHAGE Take 500 mg by mouth 2 (two) times daily with a meal.   oxyCODONE-acetaminophen 5-325 MG tablet Commonly known as: PERCOCET/ROXICET Take 1-2 tablets by mouth every 6 (six) hours as needed for moderate pain.   Potassium Citrate 15 MEQ (1620 MG) Tbcr Take 1 tablet by mouth 3 (three) times daily with meals.   pravastatin 20 MG tablet Commonly known as: PRAVACHOL Take 20 mg by mouth daily.   sildenafil 20 MG tablet Commonly known as: REVATIO Take 1-5 tablets as needed , 1 hour prior intercourse   valsartan-hydrochlorothiazide 160-12.5 MG tablet Commonly known as: DIOVAN-HCT Take 1 tablet by mouth daily.        Allergies:  No Known Allergies  Family History: Family History  Problem Relation Age of Onset   Colon polyps Brother        2008    Social History:   reports that he has never smoked. He has never used smokeless tobacco. He reports current alcohol use. He reports current drug use. Drug: Marijuana.  Physical Exam: BP (!) 153/82   Pulse 82   Constitutional:  Alert and oriented, no acute distress, nontoxic appearing HEENT: [place], AT Cardiovascular: No clubbing, cyanosis, or edema Respiratory: Normal respiratory effort, no increased work of breathing  Skin: No rashes, bruises or suspicious lesions Neurologic: Grossly intact, no focal deficits, moving all 4 extremities Psychiatric: Normal mood and affect  Laboratory Data: Results for orders placed or performed in visit on 08/25/22  Litholink Serum Panel  Result Value Ref Range   Uric Acid 7.6 3.8 - 8.4 mg/dL   Creatinine, Ser 1.61 (H) 0.76 - 1.27 mg/dL   eGFR 61 >09 UE/AVW/0.98   Sodium 139 134 - 144 mmol/L   Potassium 4.3 3.5 - 5.2 mmol/L   Chloride 101 96 - 106 mmol/L   CO2 23 20 - 29 mmol/L   Calcium 9.6 8.6 - 10.2 mg/dL   Phosphorus 3.5 2.8  - 4.1 mg/dL   Magnesium 2.2 1.6 - 2.3 mg/dL   Assessment & Plan:   1. Low urine pH I recommended starting potassium citrate 3 times daily with meals to match his eating schedule.  Will plan for lab visit for BMP to reassess his serum potassium in about 4 weeks.  He is in agreement with this plan.  We discussed repeating Litholink in about 6 months to reassess progress on drug regimen. - Potassium Citrate 15 MEQ (1620 MG) TBCR; Take 1 tablet by mouth 3 (three) times daily with meals.  Dispense: 90 tablet; Refill: 2  2. Hypocitraturia See above. - Potassium Citrate 15 MEQ (1620 MG) TBCR; Take 1 tablet by mouth 3 (three) times daily with meals.  Dispense: 90 tablet; Refill: 2  3. Hyperoxaluria We also discussed keeping a low oxalate diet and I gave him my oxalate content resource today.  I also encouraged him to stay well-hydrated, urine volume was within range.  Return in about 4 weeks (around 12/08/2022) for Lab visit for BMP.  Carman Ching, PA-C  [facility]  [address], [address] [place], [place] [address] [phone]

## 2022-12-09 ENCOUNTER — Other Ambulatory Visit: Payer: Medicare HMO

## 2022-12-14 ENCOUNTER — Telehealth: Payer: Self-pay

## 2022-12-14 ENCOUNTER — Other Ambulatory Visit: Payer: Medicare HMO

## 2022-12-14 ENCOUNTER — Other Ambulatory Visit: Payer: Self-pay

## 2022-12-14 DIAGNOSIS — R82998 Other abnormal findings in urine: Secondary | ICD-10-CM

## 2022-12-14 DIAGNOSIS — N400 Enlarged prostate without lower urinary tract symptoms: Secondary | ICD-10-CM

## 2022-12-14 DIAGNOSIS — R82992 Hyperoxaluria: Secondary | ICD-10-CM

## 2022-12-14 DIAGNOSIS — R82991 Hypocitraturia: Secondary | ICD-10-CM

## 2022-12-14 NOTE — Telephone Encounter (Signed)
Morrie Sheldon from [facility] left message stating she was following up on refill request for patient's potassium 15 meq prescription to be sent to them if approved. Last refilled to local pharmacy. I do not see any request prior to this to be noted in the chart. Is it ok to refill to mail order?

## 2022-12-15 LAB — BASIC METABOLIC PANEL
BUN/Creatinine Ratio: 12 (ref 10–24)
BUN: 15 mg/dL (ref 8–27)
CO2: 26 mmol/L (ref 20–29)
Calcium: 10.5 mg/dL — ABNORMAL HIGH (ref 8.6–10.2)
Chloride: 98 mmol/L (ref 96–106)
Creatinine, Ser: 1.28 mg/dL — ABNORMAL HIGH (ref 0.76–1.27)
Glucose: 104 mg/dL — ABNORMAL HIGH (ref 70–99)
Potassium: 4.6 mmol/L (ref 3.5–5.2)
Sodium: 139 mmol/L (ref 134–144)
eGFR: 61 mL/min/{1.73_m2} (ref >59)

## 2022-12-20 MED ORDER — POTASSIUM CITRATE ER 15 MEQ (1620 MG) PO TBCR
1.0000 | EXTENDED_RELEASE_TABLET | Freq: Three times a day (TID) | ORAL | 11 refills | Status: DC
Start: 2022-12-20 — End: 2023-11-14

## 2022-12-20 NOTE — Addendum Note (Signed)
Addended by: Debarah Crape on: 12/20/2022 12:13 PM   Modules accepted: Orders

## 2022-12-20 NOTE — Telephone Encounter (Signed)
Rx sent to [facility]

## 2022-12-29 ENCOUNTER — Other Ambulatory Visit: Payer: Self-pay

## 2022-12-29 DIAGNOSIS — R82992 Hyperoxaluria: Secondary | ICD-10-CM

## 2023-01-20 ENCOUNTER — Other Ambulatory Visit: Payer: Medicare HMO

## 2023-01-20 ENCOUNTER — Telehealth: Payer: Self-pay | Admitting: Physician Assistant

## 2023-01-20 DIAGNOSIS — R82992 Hyperoxaluria: Secondary | ICD-10-CM

## 2023-01-20 NOTE — Telephone Encounter (Signed)
Pt came in for lab work this morning for BMP.  He was under the impression he was to get this monthly.  Please advise.

## 2023-01-21 LAB — BASIC METABOLIC PANEL
BUN/Creatinine Ratio: 11 (ref 10–24)
BUN: 16 mg/dL (ref 8–27)
CO2: 24 mmol/L (ref 20–29)
Calcium: 10.2 mg/dL (ref 8.6–10.2)
Chloride: 100 mmol/L (ref 96–106)
Creatinine, Ser: 1.41 mg/dL — ABNORMAL HIGH (ref 0.76–1.27)
Glucose: 139 mg/dL — ABNORMAL HIGH (ref 70–99)
Potassium: 4.5 mmol/L (ref 3.5–5.2)
Sodium: 142 mmol/L (ref 134–144)
eGFR: 55 mL/min/{1.73_m2} — ABNORMAL LOW (ref >59)

## 2023-01-24 NOTE — Telephone Encounter (Signed)
Nope, we check it twice in the first ~6 weeks to make sure his potassium is stable on potassium citrate. His is stable, so no further close monitoring is needed for now. He should keep scheduled follow up with Dr. Apolinar Junes in July.

## 2023-01-25 NOTE — Telephone Encounter (Signed)
LVM for pt to return call

## 2023-01-25 NOTE — Telephone Encounter (Signed)
Pt informed, voiced understanding

## 2023-02-04 ENCOUNTER — Other Ambulatory Visit: Payer: Self-pay | Admitting: *Deleted

## 2023-02-04 ENCOUNTER — Other Ambulatory Visit: Payer: Medicare HMO

## 2023-02-04 DIAGNOSIS — N400 Enlarged prostate without lower urinary tract symptoms: Secondary | ICD-10-CM

## 2023-02-04 NOTE — Addendum Note (Signed)
 Addended by: Sueanne Margarita on: 02/04/2023 09:25 AM   Modules accepted: Orders

## 2023-02-05 LAB — BASIC METABOLIC PANEL
BUN/Creatinine Ratio: 14 (ref 10–24)
BUN: 20 mg/dL (ref 8–27)
CO2: 23 mmol/L (ref 20–29)
Calcium: 9.8 mg/dL (ref 8.6–10.2)
Chloride: 99 mmol/L (ref 96–106)
Creatinine, Ser: 1.41 mg/dL — ABNORMAL HIGH (ref 0.76–1.27)
Glucose: 198 mg/dL — ABNORMAL HIGH (ref 70–99)
Potassium: 4.3 mmol/L (ref 3.5–5.2)
Sodium: 140 mmol/L (ref 134–144)
eGFR: 55 mL/min/{1.73_m2} — ABNORMAL LOW (ref >59)

## 2023-02-16 ENCOUNTER — Other Ambulatory Visit: Payer: Self-pay | Admitting: Physician Assistant

## 2023-02-16 DIAGNOSIS — R7989 Other specified abnormal findings of blood chemistry: Secondary | ICD-10-CM

## 2023-02-16 NOTE — Progress Notes (Signed)
His renal function remains slightly elevated over baseline. Let's get a renal ultrasound to rule out hydronephrosis as the cause. If normal, I'd like him to follow up with his PCP for further evaluation. I'm putting in the [place] order now and the imaging department will call him to schedule.

## 2023-03-02 ENCOUNTER — Ambulatory Visit
Admission: RE | Admit: 2023-03-02 | Discharge: 2023-03-02 | Disposition: A | Discharge status: Home / Self Care | Payer: Medicare HMO | Source: Ambulatory Visit | Attending: Physician Assistant | Admitting: Physician Assistant

## 2023-03-02 DIAGNOSIS — N281 Cyst of kidney, acquired: Secondary | ICD-10-CM | POA: Diagnosis not present

## 2023-03-02 DIAGNOSIS — R7989 Other specified abnormal findings of blood chemistry: Secondary | ICD-10-CM | POA: Insufficient documentation

## 2023-07-25 ENCOUNTER — Ambulatory Visit
Admission: RE | Admit: 2023-07-25 | Discharge: 2023-07-25 | Disposition: A | Discharge status: Home / Self Care | Source: Ambulatory Visit | Attending: Urology | Admitting: Urology

## 2023-07-25 DIAGNOSIS — N200 Calculus of kidney: Secondary | ICD-10-CM | POA: Insufficient documentation

## 2023-08-10 ENCOUNTER — Ambulatory Visit: Payer: Self-pay | Admitting: Urology

## 2023-08-30 ENCOUNTER — Ambulatory Visit: Payer: Self-pay | Admitting: Physician Assistant

## 2023-08-30 VITALS — BP 160/86 | HR 86 | Ht 66.0 in | Wt 160.0 lb

## 2023-08-30 DIAGNOSIS — Z09 Encounter for follow-up examination after completed treatment for conditions other than malignant neoplasm: Secondary | ICD-10-CM

## 2023-08-30 DIAGNOSIS — Z87442 Personal history of urinary calculi: Secondary | ICD-10-CM | POA: Diagnosis not present

## 2023-08-30 NOTE — Patient Instructions (Signed)

## 2023-08-30 NOTE — Progress Notes (Unsigned)
 08/30/2023 3:46 PM   Brendan West May 23, 1955 [id number]  CC: Chief Complaint  Patient presents with   Follow-up   HPI: Brendan West is a 68 y.o. 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   Today he reports no flank pain or gross hematuria since his last office visit.  He is tolerating potassium citrate  well.  He has no acute concerns today.  Renal ultrasound dated 07/25/2023 showed no shadowing stones or hydronephrosis.  PMH: Past Medical History:  Diagnosis Date   Colon polyp    Diabetes mellitus without complication (HCC)    History of kidney stones    Hypertension    2008   Prostate enlargement     Surgical History: Past Surgical History:  Procedure Laterality Date   COLONOSCOPY  11/22/2012   Diverticulosis   COLONOSCOPY W/ BIOPSIES AND POLYPECTOMY  03/14/2007   8 mm tubular adenoma without atypica in the proximal ascending colon resected & retrieved. Diverticulosis sigmoid colon. Family history of adenometous polyps   COLONOSCOPY WITH PROPOFOL  N/A 12/07/2017   Procedure: COLONOSCOPY WITH PROPOFOL ;  Surgeon: Dessa Reyes ORN, MD;  Location: [facility] ENDOSCOPY;  Service: Endoscopy;  Laterality: N/A;   IR URETERAL STENT RIGHT NEW ACCESS W/O SEP NEPHROSTOMY CATH  07/05/2022   NEPHROLITHOTOMY Right 07/05/2022   Procedure: NEPHROLITHOTOMY PERCUTANEOUS;  Surgeon: Penne Knee, MD;  Location: [facility] ORS;  Service: Urology;  Laterality: Right;    Home Medications:  Allergies as of 08/30/2023   No Known Allergies      Medication List        Accurate as of August 30, 2023  3:46 PM. If you have any questions, ask your nurse or doctor.          Accu-Chek Guide test strip Generic drug: glucose blood   finasteride  5 MG tablet Commonly known as: PROSCAR  Take 1 tablet (5 mg total) by mouth daily.   glipiZIDE 2.5 MG  24 hr tablet Commonly known as: GLUCOTROL XL Take 2.5 mg by mouth daily with breakfast.   Jardiance 25 MG Tabs tablet Generic drug: empagliflozin Take 25 mg by mouth daily.   metFORMIN 500 MG tablet Commonly known as: GLUCOPHAGE Take 500 mg by mouth 2 (two) times daily with a meal.   oxyCODONE -acetaminophen  5-325 MG tablet Commonly known as: PERCOCET/ROXICET Take 1-2 tablets by mouth every 6 (six) hours as needed for moderate pain.   Potassium Citrate  15 MEQ (1620 MG) Tbcr Take 1 tablet by mouth 3 (three) times daily with meals.   pravastatin  20 MG tablet Commonly known as: PRAVACHOL  Take 20 mg by mouth daily.   sildenafil  20 MG tablet Commonly known as: REVATIO  Take 1-5 tablets as needed , 1 hour prior intercourse   valsartan -hydrochlorothiazide  160-12.5 MG tablet Commonly known as: DIOVAN -HCT Take 1 tablet by mouth daily.        Allergies:  No Known Allergies  Family History: Family History  Problem Relation Age of Onset   Colon polyps Brother        2008    Social History:   reports that he has never smoked. He has never used smokeless tobacco. He reports current alcohol use. He reports current drug use. Drug: Marijuana.  Physical Exam: There were no vitals taken for this visit.  Constitutional:  Alert and oriented, no acute distress, nontoxic appearing HEENT: [place], AT Cardiovascular: No clubbing,  cyanosis, or edema Respiratory: Normal respiratory effort, no increased work of breathing Skin: No rashes, bruises or suspicious lesions Neurologic: Grossly intact, no focal deficits, moving all 4 extremities Psychiatric: Normal mood and affect  Pertinent Imaging: Results for orders placed during the hospital encounter of 07/25/23  Ultrasound renal complete  Narrative CLINICAL DATA:  History of renal calculi and status post prior right percutaneous nephrolithotomy for staghorn calculus.  EXAM: RENAL / URINARY TRACT ULTRASOUND COMPLETE  COMPARISON:   03/02/2023 and 08/03/2022  FINDINGS: Right Kidney:  Renal measurements: 11.5 x 6.2 x 4.8 cm = volume: 178 mL. Echogenicity within normal limits. No mass or hydronephrosis visualized. Benign 1.0 cm upper pole and 1.3 cm mid simple cysts. No significant visualized shadowing calculus.  Left Kidney:  Renal measurements: 11.5 x 5.0 x 4.8 cm = volume: 143 mL. Echogenicity within normal limits. No mass or hydronephrosis visualized. No visualized shadowing calculus.  Bladder:  Appears normal for degree of bladder distention.  Other:  None.  IMPRESSION: No significant visualized shadowing calculus. No hydronephrosis. Benign right renal cysts.   Electronically Signed By: Marcey Moan M.D. On: 07/30/2023 09:51   I personally reviewed the images referenced above and note no hydronephrosis or shadowing stones.  Assessment & Plan:   1. History of nephrolithiasis (Primary) No symptomatic stone episodes since his last visit.  No evidence of recurrent stones on renal ultrasound.  Will continue potassium citrate  and repeat [facility] in about 9 months. - [facility] 24Hr Urine Panel; Future - [facility] Serum Panel; Future   Return in about 9 months (around 05/29/2024) for Follow up with [facility] prior, IPSS, SHIM, PSA.  Lucie Hones, PA-C  [facility] [place] [address], [address] [place], [place] [address] [phone]

## 2023-09-06 ENCOUNTER — Other Ambulatory Visit: Payer: Self-pay

## 2023-09-06 DIAGNOSIS — N400 Enlarged prostate without lower urinary tract symptoms: Secondary | ICD-10-CM

## 2023-09-06 MED ORDER — FINASTERIDE 5 MG PO TABS
5.0000 mg | ORAL_TABLET | Freq: Every day | ORAL | 3 refills | Status: AC
Start: 2023-09-06 — End: ?

## 2023-11-14 ENCOUNTER — Other Ambulatory Visit: Payer: Self-pay | Admitting: Physician Assistant

## 2023-11-14 DIAGNOSIS — R82998 Other abnormal findings in urine: Secondary | ICD-10-CM

## 2023-11-14 DIAGNOSIS — R82992 Hyperoxaluria: Secondary | ICD-10-CM

## 2023-11-14 DIAGNOSIS — R82991 Hypocitraturia: Secondary | ICD-10-CM

## 2023-12-14 ENCOUNTER — Ambulatory Visit: Admitting: Physician Assistant

## 2023-12-14 VITALS — BP 168/87 | HR 75

## 2023-12-14 DIAGNOSIS — R972 Elevated prostate specific antigen [PSA]: Secondary | ICD-10-CM

## 2023-12-14 NOTE — Progress Notes (Signed)
 12/14/2023 5:22 PM   Brendan West 1955-12-29 [id number]  CC: Chief Complaint  Patient presents with   Follow-up   HPI: Brendan West is a 68 y.o. 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   Today he reports he realizes he has not had a DRE in a couple of years and suspects he is due for one.  He denies any acute concerns or symptoms.  Most recent PSA dated 03/01/2022 was normal at 1.6.  He is due for routine follow-up with me next year.  PMH: Past Medical History:  Diagnosis Date   Colon polyp    Diabetes mellitus without complication (HCC)    History of kidney stones    Hypertension    2008   Prostate enlargement     Surgical History: Past Surgical History:  Procedure Laterality Date   COLONOSCOPY  11/22/2012   Diverticulosis   COLONOSCOPY W/ BIOPSIES AND POLYPECTOMY  03/14/2007   8 mm tubular adenoma without atypica in the proximal ascending colon resected & retrieved. Diverticulosis sigmoid colon. Family history of adenometous polyps   COLONOSCOPY WITH PROPOFOL  N/A 12/07/2017   Procedure: COLONOSCOPY WITH PROPOFOL ;  Surgeon: Dessa Reyes ORN, MD;  Location: [facility] ENDOSCOPY;  Service: Endoscopy;  Laterality: N/A;   IR URETERAL STENT RIGHT NEW ACCESS W/O SEP NEPHROSTOMY CATH  07/05/2022   NEPHROLITHOTOMY Right 07/05/2022   Procedure: NEPHROLITHOTOMY PERCUTANEOUS;  Surgeon: Penne Knee, MD;  Location: [facility] ORS;  Service: Urology;  Laterality: Right;    Home Medications:  Allergies as of 12/14/2023   No Known Allergies      Medication List        Accurate as of December 14, 2023  5:22 PM. If you have any questions, ask your nurse or doctor.          Accu-Chek Guide test strip Generic drug: glucose blood   finasteride  5 MG tablet Commonly known as: PROSCAR  Take 1 tablet (5 mg total) by mouth daily.    glipiZIDE 2.5 MG 24 hr tablet Commonly known as: GLUCOTROL XL Take 2.5 mg by mouth daily with breakfast.   Jardiance 25 MG Tabs tablet Generic drug: empagliflozin Take 25 mg by mouth daily.   metFORMIN 500 MG tablet Commonly known as: GLUCOPHAGE Take 500 mg by mouth 2 (two) times daily with a meal.   Potassium Citrate  15 MEQ (1620 MG) Tbcr TAKE 1 TABLET THREE TIMES DAILY WITH MEALS   pravastatin  20 MG tablet Commonly known as: PRAVACHOL  Take 20 mg by mouth daily.   sildenafil  20 MG tablet Commonly known as: REVATIO  Take 1-5 tablets as needed , 1 hour prior intercourse   valsartan -hydrochlorothiazide  160-12.5 MG tablet Commonly known as: DIOVAN -HCT Take 1 tablet by mouth daily.        Allergies:  No Known Allergies  Family History: Family History  Problem Relation Age of Onset   Colon polyps Brother        2008    Social History:   reports that he has never smoked. He has never used smokeless tobacco. He reports current alcohol use. He reports current drug use. Drug: Marijuana.  Physical Exam: BP (!) 168/87   Pulse 75   Constitutional:  Alert and oriented, no acute distress, nontoxic appearing HEENT: , AT Cardiovascular: No clubbing, cyanosis, or edema Respiratory: Normal respiratory effort, no increased work of breathing GU: Normal  sphincter tone.  Smooth, symmetrically enlarged 40+ cc prostate without nodules or induration.  Exam limited to the apex and mid gland. Skin: No rashes, bruises or suspicious lesions Neurologic: Grossly intact, no focal deficits, moving all 4 extremities Psychiatric: Normal mood and affect  Assessment & Plan:   1. Elevated PSA (Primary) DRE today per patient request, benign.  Will continue to monitor PSA, which is appropriate in the setting of finasteride  use for BPH.  Return for Keep follow-up as scheduled.  Lucie Hones, PA-C  [facility] [place] [address], [address] [place], [place]  [address] [phone]

## 2024-05-29 ENCOUNTER — Ambulatory Visit: Admitting: Physician Assistant
